# Patient Record
Sex: Female | Born: 1965 | Race: White | Hispanic: No | Smoking: Current every day smoker
Health system: Southern US, Community
[De-identification: ages and names within clinical notes are randomized; demographics above are authoritative.]

## PROBLEM LIST (undated history)

## (undated) DIAGNOSIS — M549 Dorsalgia, unspecified: Secondary | ICD-10-CM

## (undated) DIAGNOSIS — M1990 Unspecified osteoarthritis, unspecified site: Secondary | ICD-10-CM

## (undated) DIAGNOSIS — J45909 Unspecified asthma, uncomplicated: Secondary | ICD-10-CM

## (undated) DIAGNOSIS — E079 Disorder of thyroid, unspecified: Secondary | ICD-10-CM

## (undated) DIAGNOSIS — I471 Supraventricular tachycardia: Secondary | ICD-10-CM

## (undated) DIAGNOSIS — G8929 Other chronic pain: Secondary | ICD-10-CM

## (undated) DIAGNOSIS — M797 Fibromyalgia: Secondary | ICD-10-CM

## (undated) DIAGNOSIS — L4050 Arthropathic psoriasis, unspecified: Secondary | ICD-10-CM

## (undated) DIAGNOSIS — I10 Essential (primary) hypertension: Secondary | ICD-10-CM

## (undated) HISTORY — PX: LAPAROSCOPIC OVARIAN: SHX5906

## (undated) HISTORY — DX: Dorsalgia, unspecified: M54.9

## (undated) HISTORY — PX: TONSILLECTOMY: SUR1361

## (undated) HISTORY — DX: Arthropathic psoriasis, unspecified: L40.50

## (undated) HISTORY — DX: Other chronic pain: G89.29

## (undated) HISTORY — PX: APPENDECTOMY: SHX54

---

## 2013-11-03 ENCOUNTER — Emergency Department (HOSPITAL_COMMUNITY)
Admission: EM | Admit: 2013-11-03 | Discharge: 2013-11-03 | Disposition: A | Discharge status: Home / Self Care | Payer: Medicaid Other | Attending: Emergency Medicine | Admitting: Emergency Medicine

## 2013-11-03 ENCOUNTER — Encounter (HOSPITAL_COMMUNITY): Payer: Self-pay | Admitting: Emergency Medicine

## 2013-11-03 DIAGNOSIS — Z91040 Latex allergy status: Secondary | ICD-10-CM | POA: Insufficient documentation

## 2013-11-03 DIAGNOSIS — Y9301 Activity, walking, marching and hiking: Secondary | ICD-10-CM | POA: Insufficient documentation

## 2013-11-03 DIAGNOSIS — J45909 Unspecified asthma, uncomplicated: Secondary | ICD-10-CM | POA: Insufficient documentation

## 2013-11-03 DIAGNOSIS — Y929 Unspecified place or not applicable: Secondary | ICD-10-CM | POA: Insufficient documentation

## 2013-11-03 DIAGNOSIS — I10 Essential (primary) hypertension: Secondary | ICD-10-CM | POA: Insufficient documentation

## 2013-11-03 DIAGNOSIS — S91009A Unspecified open wound, unspecified ankle, initial encounter: Principal | ICD-10-CM

## 2013-11-03 DIAGNOSIS — F17200 Nicotine dependence, unspecified, uncomplicated: Secondary | ICD-10-CM | POA: Insufficient documentation

## 2013-11-03 DIAGNOSIS — W268XXA Contact with other sharp object(s), not elsewhere classified, initial encounter: Secondary | ICD-10-CM | POA: Insufficient documentation

## 2013-11-03 DIAGNOSIS — S81809A Unspecified open wound, unspecified lower leg, initial encounter: Principal | ICD-10-CM

## 2013-11-03 DIAGNOSIS — M129 Arthropathy, unspecified: Secondary | ICD-10-CM | POA: Insufficient documentation

## 2013-11-03 DIAGNOSIS — Z79899 Other long term (current) drug therapy: Secondary | ICD-10-CM | POA: Insufficient documentation

## 2013-11-03 DIAGNOSIS — S81009A Unspecified open wound, unspecified knee, initial encounter: Secondary | ICD-10-CM | POA: Insufficient documentation

## 2013-11-03 DIAGNOSIS — S81812A Laceration without foreign body, left lower leg, initial encounter: Secondary | ICD-10-CM

## 2013-11-03 DIAGNOSIS — F411 Generalized anxiety disorder: Secondary | ICD-10-CM | POA: Insufficient documentation

## 2013-11-03 HISTORY — DX: Essential (primary) hypertension: I10

## 2013-11-03 HISTORY — DX: Unspecified asthma, uncomplicated: J45.909

## 2013-11-03 HISTORY — DX: Fibromyalgia: M79.7

## 2013-11-03 HISTORY — DX: Supraventricular tachycardia: I47.1

## 2013-11-03 HISTORY — DX: Unspecified osteoarthritis, unspecified site: M19.90

## 2013-11-03 MED ORDER — LIDOCAINE-EPINEPHRINE-TETRACAINE (LET) SOLUTION
3.0000 mL | Freq: Once | NASAL | Status: AC
  Administered 2013-11-03: 3 mL via TOPICAL
  Filled 2013-11-03: qty 3

## 2013-11-03 MED ORDER — LIDOCAINE-EPINEPHRINE 1 %-1:100000 IJ SOLN
10.0000 mL | Freq: Once | INTRAMUSCULAR | Status: AC
  Administered 2013-11-03: 10 mL via INTRADERMAL

## 2013-11-03 MED ORDER — DOUBLE ANTIBIOTIC 500-10000 UNIT/GM EX OINT
TOPICAL_OINTMENT | Freq: Once | CUTANEOUS | Status: AC
  Administered 2013-11-03: 17:00:00 via TOPICAL

## 2013-11-03 MED ORDER — BACITRACIN ZINC 500 UNIT/GM EX OINT
TOPICAL_OINTMENT | CUTANEOUS | Status: AC
  Filled 2013-11-03: qty 0.9

## 2013-11-03 NOTE — ED Provider Notes (Signed)
CSN: [id number]     Arrival date & time 11/03/13  1526 History   First MD Initiated Contact with Patient 11/03/13 1542     Chief Complaint  Patient presents with  . Extremity Laceration     (Consider location/radiation/quality/duration/timing/severity/associated sxs/prior Treatment) HPI Patient reports she was at her friend's house who had a large star decoration she was going to attach to the side of her house however it was sitting on the ground and the patient didn't see it and she ran her left leg into one of the tips of the star. This happened less than an hour ago. She did not fall and denies any other injury. She states her tetanus is up-to-date.  PCP none  Past Medical History  Diagnosis Date  . Asthma   . Arthritis   . SVT (supraventricular tachycardia)   . Fibromyalgia   . Hypertension    Past Surgical History  Procedure Laterality Date  . Cesarean section    . Appendectomy     cardiac ablation  History reviewed. No pertinent family history. History  Substance Use Topics  . Smoking status: Current Some Day Smoker  . Smokeless tobacco: Not on file  . Alcohol Use: No   smokes 3-5 cigarettes a day On disability for fibromyalgia and psoriatic arthritis  OB History   Grav Para Term Preterm Abortions TAB SAB Ect Mult Living                 Review of Systems  All other systems reviewed and are negative.     Allergies  Adalat and Latex  Home Medications   Prior to Admission medications   Medication Sig Start Date End Date Taking? Authorizing Provider  buPROPion (WELLBUTRIN XL) 150 MG 24 hr tablet Take 150 mg by mouth daily.   Yes Historical Provider, MD  cyclobenzaprine (FLEXERIL) 10 MG tablet Take 10 mg by mouth 3 (three) times daily as needed for muscle spasms.   Yes Historical Provider, MD  LORazepam (ATIVAN) 2 MG tablet Take 2 mg by mouth daily as needed for anxiety.   Yes Historical Provider, MD  losartan (COZAAR) 100 MG tablet Take 100 mg by mouth  daily.   Yes Historical Provider, MD  omeprazole (PRILOSEC) 20 MG capsule Take 20 mg by mouth daily.   Yes Historical Provider, MD   Diovan Paroxetine Omeprazole Levoxyl bupropion      BP 184/112  Pulse 107  Temp(Src) 98 F (36.7 C) (Oral)  Resp 18  Ht 5\' 4"  (1.626 m)  Wt 230 lb (104.327 kg)  BMI 39.46 kg/m2  SpO2 98%  Vital signs normal except hypertension and tachycardia  Physical Exam  Nursing note and vitals reviewed. Constitutional: She is oriented to person, place, and time. She appears well-developed and well-nourished.  Non-toxic appearance. She does not appear ill. No distress.  HENT:  Head: Normocephalic and atraumatic.  Right Ear: External ear normal.  Left Ear: External ear normal.  Nose: Nose normal. No mucosal edema or rhinorrhea.  Mouth/Throat: Mucous membranes are normal. No dental abscesses or uvula swelling.  Eyes: Conjunctivae and EOM are normal. Pupils are equal, round, and reactive to light.  Neck: Normal range of motion and full passive range of motion without pain. Neck supple.  Pulmonary/Chest: Effort normal. No respiratory distress. She has no rhonchi. She exhibits no crepitus.  Abdominal: Normal appearance.  Musculoskeletal: Normal range of motion. She exhibits tenderness. She exhibits no edema.  Moves all extremities well.   Neurological: She is  alert and oriented to person, place, and time. She has normal strength. No cranial nerve deficit.  Skin: Skin is warm, dry and intact. No rash noted. No erythema. No pallor.  The patient has a 1 x 1 cm V-shaped laceration over her proximal left lateral leg inferior to the knee. It is through the dermis. There is mild bleeding. There is no debris noted on the wound. Please see photo  Psychiatric: Her speech is normal and behavior is normal. Her mood appears anxious.          ED Course  Procedures (including critical care time)  Medications  lidocaine-EPINEPHrine-tetracaine (LET) solution (3 mLs  Topical Given 11/03/13 1556)  polymixin-bacitracin (POLYSPORIN) ointment ( Topical Given 11/03/13 1641)  lidocaine-EPINEPHrine (XYLOCAINE W/EPI) 1 %-1:100000 (with pres) injection 10 mL (10 mLs Intradermal Given 11/03/13 1637)   The patient was concerned about being injected with Xylocaine. 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 Assured that we would not be going that deeply to numb her skin. She is agreeable to having LET placed on her skin first.   Patient states she has taken her blood pressure medicine today. She also states her blood pressure would improve when she goes home.  LACERATION REPAIR Performed by: Janice Norrie Authorized by: Janice Norrie Consent: Verbal consent obtained. Risks and benefits: risks, benefits and alternatives were discussed Consent given by: patient Patient identity confirmed: provided demographic data Prepped and Draped in normal sterile fashion Wound explored  Laceration Location: Proximal left lower leg  Laceration Length: 2 cm  No Foreign Bodies seen or palpated  Anesthesia: local infiltration and LET  Local anesthetic: lidocaine 1% + 1 epinephrine  Anesthetic total: 5 ml  Irrigation method: syringe with sterile saline/Betadine mix Amount of cleaning: standard  Skin closure: 4-0 nylon   Number of sutures: 3   Technique: Simple interrupted with mattress type suture to close the tip   Patient tolerance: Patient tolerated the procedure well with no immediate complications.     Labs Review Labs Reviewed - No data to display  Imaging Review No results found.   EKG Interpretation None      MDM   Final diagnoses:  Laceration of lower leg, left, initial encounter  Essential hypertension    Plan discharge   Rolland Porter, MD, Alanson Aly, MD 11/04/13 0020

## 2013-11-03 NOTE — Discharge Instructions (Signed)
Keep the wound clean and dry. Do use triple antibiotic ointment on the wound to help prevent infection. The sutures need to be removed in 10-12 days. You can return here or go to urgent care to have them removed. You should return sooner however for any signs of infection such as increasing redness, swelling, pain, drainage of pus, red streaks. You can use ice packs on the wound for discomfort. You can take acetaminophen or ibuprofen for pain as needed.    Sutured Wound Care Sutures are stitches that can be used to close wounds. Wound care helps prevent pain and infection.  HOME CARE INSTRUCTIONS   Rest and elevate the injured area until all the pain and swelling are gone.  Only take over-the-counter or prescription medicines for pain, discomfort, or fever as directed by your caregiver.  After 48 hours, gently wash the area with mild soap and water once a day, or as directed. Rinse off the soap. Pat the area dry with a clean towel. Do not rub the wound. This may cause bleeding.  Follow your caregiver's instructions for how often to change the bandage (dressing). Stop using a dressing after 2 days or after the wound stops draining.  If the dressing sticks, moisten it with soapy water and gently remove it.  Apply ointment on the wound as directed.  Avoid stretching a sutured wound.  Drink enough fluids to keep your urine clear or pale yellow.  Follow up with your caregiver for suture removal as directed.  Use sunscreen on your wound for the next 3 to 6 months so the scar will not darken. SEEK IMMEDIATE MEDICAL CARE IF:   Your wound becomes red, swollen, hot, or tender.  You have increasing pain in the wound.  You have a red streak that extends from the wound.  There is pus coming from the wound.  You have a fever.  You have shaking chills.  There is a bad smell coming from the wound.  You have persistent bleeding from the wound. MAKE SURE YOU:   Understand these  instructions.  Will watch your condition.  Will get help right away if you are not doing well or get worse. Document Released: 06/07/2004 Document Revised: 07/23/2011 Document Reviewed: 09/03/2010 [facility] Patient Information 2015 [place], [place]. This information is not intended to replace advice given to you by your health care provider. Make sure you discuss any questions you have with your health care provider.

## 2013-11-03 NOTE — ED Notes (Signed)
Non-stick dressing applied to pt's wound on L. Lower extremity.

## 2013-11-03 NOTE — ED Notes (Signed)
Pt presents with laceration to lower left leg. Pt states she cut her leg on an object while walking. Pt's last tetanus was 2 years ago.

## 2013-12-10 ENCOUNTER — Emergency Department (HOSPITAL_COMMUNITY)
Admission: EM | Admit: 2013-12-10 | Discharge: 2013-12-10 | Disposition: A | Discharge status: Home / Self Care | Payer: Medicaid Other | Attending: Emergency Medicine | Admitting: Emergency Medicine

## 2013-12-10 ENCOUNTER — Encounter (HOSPITAL_COMMUNITY): Payer: Self-pay | Admitting: Emergency Medicine

## 2013-12-10 DIAGNOSIS — H9209 Otalgia, unspecified ear: Secondary | ICD-10-CM | POA: Diagnosis present

## 2013-12-10 DIAGNOSIS — J45909 Unspecified asthma, uncomplicated: Secondary | ICD-10-CM | POA: Diagnosis not present

## 2013-12-10 DIAGNOSIS — Z79899 Other long term (current) drug therapy: Secondary | ICD-10-CM | POA: Diagnosis not present

## 2013-12-10 DIAGNOSIS — Z91040 Latex allergy status: Secondary | ICD-10-CM | POA: Diagnosis not present

## 2013-12-10 DIAGNOSIS — Z862 Personal history of diseases of the blood and blood-forming organs and certain disorders involving the immune mechanism: Secondary | ICD-10-CM | POA: Insufficient documentation

## 2013-12-10 DIAGNOSIS — F17200 Nicotine dependence, unspecified, uncomplicated: Secondary | ICD-10-CM | POA: Diagnosis not present

## 2013-12-10 DIAGNOSIS — I10 Essential (primary) hypertension: Secondary | ICD-10-CM | POA: Insufficient documentation

## 2013-12-10 DIAGNOSIS — H65199 Other acute nonsuppurative otitis media, unspecified ear: Secondary | ICD-10-CM | POA: Insufficient documentation

## 2013-12-10 DIAGNOSIS — M129 Arthropathy, unspecified: Secondary | ICD-10-CM | POA: Insufficient documentation

## 2013-12-10 DIAGNOSIS — Z8639 Personal history of other endocrine, nutritional and metabolic disease: Secondary | ICD-10-CM | POA: Insufficient documentation

## 2013-12-10 HISTORY — DX: Disorder of thyroid, unspecified: E07.9

## 2013-12-10 MED ORDER — LEVONORGESTREL 0.75 MG PO TABS: ORAL_TABLET | ORAL | Status: DC

## 2013-12-10 MED ORDER — AMOXICILLIN 500 MG PO CAPS: 500.0000 mg | ORAL_CAPSULE | Freq: Three times a day (TID) | ORAL | Status: DC

## 2013-12-10 NOTE — ED Provider Notes (Signed)
CSN: [id number]     Arrival date & time 12/10/13  1330 History  This chart was scribed for Maudry Diego, MD by Roxan Diesel, ED scribe.  This patient was seen in room APA09/APA09 and the patient's care was started at 1:49 PM.   Chief Complaint  Patient presents with  . Otalgia    Patient is a 48 y.o. female presenting with ear pain. The history is provided by the patient. No language interpreter was used.  Otalgia Location:  Bilateral Behind ear:  No abnormality Quality:  Pressure Severity:  Moderate Duration:  1 day Timing:  Constant Chronicity:  Recurrent Ineffective treatments:  None tried Associated symptoms: no abdominal pain, no congestion, no cough, no diarrhea, no ear discharge, no headaches and no rash     HPI Comments: Stephanie Osborne is a 48 y.o. female who presents to the Emergency Department complaining of persistent moderate bilateral ear pain and pressure that began last night.  She reports h/o recurrent sinus infections and speculates this may be the cause of her pain.  Pt also presents with elevated blood pressure at 169/112.  She states she saw her PCP back in [place] and was advised she may need to increase her BP medication dosage to 3x/day.  She does not remember what the medication is called.  She has not been able to find a PCP here since moving from [place] in May.  Pt states she had unprotected sex last night and wants a morning after pill.    Past Medical History  Diagnosis Date  . Asthma   . Arthritis   . SVT (supraventricular tachycardia)   . Fibromyalgia   . Hypertension   . Thyroid disease     Past Surgical History  Procedure Laterality Date  . Cesarean section    . Appendectomy      History reviewed. No pertinent family history.   History  Substance Use Topics  . Smoking status: Current Some Day Smoker  . Smokeless tobacco: Not on file  . Alcohol Use: No    OB History   Grav Para Term Preterm Abortions TAB SAB Ect Mult  Living                   Review of Systems  Constitutional: Negative for appetite change and fatigue.  HENT: Positive for ear pain. Negative for congestion, ear discharge and sinus pressure.   Eyes: Negative for discharge.  Respiratory: Negative for cough.   Cardiovascular: Negative for chest pain.  Gastrointestinal: Negative for abdominal pain and diarrhea.  Genitourinary: Negative for frequency and hematuria.  Musculoskeletal: Negative for back pain.  Skin: Negative for rash.  Neurological: Negative for seizures and headaches.  Psychiatric/Behavioral: Negative for hallucinations.      Allergies  Adalat and Latex  Home Medications   Prior to Admission medications   Medication Sig Start Date End Date Taking? Authorizing Provider  buPROPion (WELLBUTRIN XL) 150 MG 24 hr tablet Take 150 mg by mouth daily.    Historical Provider, MD  cyclobenzaprine (FLEXERIL) 10 MG tablet Take 10 mg by mouth 3 (three) times daily as needed for muscle spasms.    Historical Provider, MD  LORazepam (ATIVAN) 2 MG tablet Take 2 mg by mouth daily as needed for anxiety.    Historical Provider, MD  losartan (COZAAR) 100 MG tablet Take 100 mg by mouth daily.    Historical Provider, MD  omeprazole (PRILOSEC) 20 MG capsule Take 20 mg by mouth daily.    Historical  Provider, MD   BP 169/112  Pulse 113  Temp(Src) 99.2 F (37.3 C) (Oral)  Resp 16  Ht 5\' 4"  (1.626 m)  Wt 230 lb (104.327 kg)  BMI 39.46 kg/m2  SpO2 99%  LMP 11/11/2013  Physical Exam  Nursing note and vitals reviewed. Constitutional: She is oriented to person, place, and time. She appears well-developed.  HENT:  Head: Normocephalic.  Right TM inflamed  Eyes: Conjunctivae and EOM are normal. No scleral icterus.  Neck: Neck supple. No thyromegaly present.  Cardiovascular: Normal rate and regular rhythm.  Exam reveals no gallop and no friction rub.   No murmur heard. Pulmonary/Chest: No stridor. She has no wheezes. She has no rales.  She exhibits no tenderness.  Abdominal: She exhibits no distension. There is no tenderness. There is no rebound.  Musculoskeletal: Normal range of motion. She exhibits no edema.  Lymphadenopathy:    She has no cervical adenopathy.  Neurological: She is oriented to person, place, and time. She exhibits normal muscle tone. Coordination normal.  Skin: No rash noted. No erythema.  Psychiatric: She has a normal mood and affect. Her behavior is normal.    ED Course  Procedures (including critical care time)  DIAGNOSTIC STUDIES: Oxygen Saturation is 99% on room air, normal by my interpretation.    COORDINATION OF CARE: 1:56 PM-Discussed treatment plan with pt at bedside and pt agreed to plan.     Labs Review Labs Reviewed - No data to display  Imaging Review No results found.   EKG Interpretation None      MDM   Final diagnoses:  None    Htn, otitis media,  Birth control.  The chart was scribed for me under my direct supervision.  I personally performed the history, physical, and medical decision making and all procedures in the evaluation of this patient.Maudry Diego, MD 12/10/13 [phone]

## 2013-12-10 NOTE — ED Notes (Addendum)
Pain bil ears since last night, Recent sinus problem  Pt had unprotected sex last night. And wants morning after pill

## 2013-12-10 NOTE — Discharge Instructions (Signed)
Increase your bp medicine to one and a half pills twice a day.  Follow up with a family md

## 2014-01-27 ENCOUNTER — Other Ambulatory Visit: Payer: Self-pay | Admitting: Women's Health

## 2014-01-29 ENCOUNTER — Encounter (HOSPITAL_COMMUNITY): Payer: Self-pay | Admitting: Emergency Medicine

## 2014-01-29 ENCOUNTER — Emergency Department (HOSPITAL_COMMUNITY)
Admission: EM | Admit: 2014-01-29 | Discharge: 2014-01-30 | Disposition: A | Discharge status: Home / Self Care | Payer: Medicaid Other | Attending: Emergency Medicine | Admitting: Emergency Medicine

## 2014-01-29 ENCOUNTER — Emergency Department (HOSPITAL_COMMUNITY): Payer: Medicaid Other

## 2014-01-29 DIAGNOSIS — R55 Syncope and collapse: Secondary | ICD-10-CM | POA: Insufficient documentation

## 2014-01-29 DIAGNOSIS — Z8639 Personal history of other endocrine, nutritional and metabolic disease: Secondary | ICD-10-CM | POA: Insufficient documentation

## 2014-01-29 DIAGNOSIS — R002 Palpitations: Secondary | ICD-10-CM | POA: Insufficient documentation

## 2014-01-29 DIAGNOSIS — I10 Essential (primary) hypertension: Secondary | ICD-10-CM | POA: Insufficient documentation

## 2014-01-29 DIAGNOSIS — Z91040 Latex allergy status: Secondary | ICD-10-CM | POA: Diagnosis not present

## 2014-01-29 DIAGNOSIS — R51 Headache: Secondary | ICD-10-CM | POA: Insufficient documentation

## 2014-01-29 DIAGNOSIS — Z8739 Personal history of other diseases of the musculoskeletal system and connective tissue: Secondary | ICD-10-CM | POA: Insufficient documentation

## 2014-01-29 DIAGNOSIS — Z862 Personal history of diseases of the blood and blood-forming organs and certain disorders involving the immune mechanism: Secondary | ICD-10-CM | POA: Insufficient documentation

## 2014-01-29 DIAGNOSIS — M129 Arthropathy, unspecified: Secondary | ICD-10-CM | POA: Insufficient documentation

## 2014-01-29 DIAGNOSIS — Z79899 Other long term (current) drug therapy: Secondary | ICD-10-CM | POA: Diagnosis not present

## 2014-01-29 DIAGNOSIS — R5381 Other malaise: Secondary | ICD-10-CM | POA: Insufficient documentation

## 2014-01-29 DIAGNOSIS — R110 Nausea: Secondary | ICD-10-CM | POA: Insufficient documentation

## 2014-01-29 DIAGNOSIS — R42 Dizziness and giddiness: Secondary | ICD-10-CM | POA: Insufficient documentation

## 2014-01-29 DIAGNOSIS — R5383 Other fatigue: Secondary | ICD-10-CM | POA: Diagnosis not present

## 2014-01-29 DIAGNOSIS — Z880 Allergy status to penicillin: Secondary | ICD-10-CM | POA: Insufficient documentation

## 2014-01-29 DIAGNOSIS — J45909 Unspecified asthma, uncomplicated: Secondary | ICD-10-CM | POA: Insufficient documentation

## 2014-01-29 DIAGNOSIS — IMO0002 Reserved for concepts with insufficient information to code with codable children: Secondary | ICD-10-CM | POA: Insufficient documentation

## 2014-01-29 LAB — COMPREHENSIVE METABOLIC PANEL
ALT: 32 U/L (ref 0–35)
AST: 29 U/L (ref 0–37)
Albumin: 3.7 g/dL (ref 3.5–5.2)
Alkaline Phosphatase: 69 U/L (ref 39–117)
Anion gap: 13 (ref 5–15)
BILIRUBIN TOTAL: 0.2 mg/dL — AB (ref 0.3–1.2)
BUN: 19 mg/dL (ref 6–23)
CALCIUM: 9.2 mg/dL (ref 8.4–10.5)
CHLORIDE: 99 meq/L (ref 96–112)
CO2: 28 mEq/L (ref 19–32)
Creatinine, Ser: 1.05 mg/dL (ref 0.50–1.10)
GFR calc non Af Amer: 61 mL/min — ABNORMAL LOW (ref >90)
GFR, EST AFRICAN AMERICAN: 71 mL/min — AB (ref >90)
Glucose, Bld: 100 mg/dL — ABNORMAL HIGH (ref 70–99)
Potassium: 3.3 mEq/L — ABNORMAL LOW (ref 3.7–5.3)
Sodium: 140 mEq/L (ref 137–147)
Total Protein: 7.6 g/dL (ref 6.0–8.3)

## 2014-01-29 LAB — CBC
HCT: 38 % (ref 36.0–46.0)
Hemoglobin: 13.1 g/dL (ref 12.0–15.0)
MCH: 28.8 pg (ref 26.0–34.0)
MCHC: 34.5 g/dL (ref 30.0–36.0)
MCV: 83.5 fL (ref 78.0–100.0)
Platelets: 287 10*3/uL (ref 150–400)
RBC: 4.55 MIL/uL (ref 3.87–5.11)
RDW: 14.1 % (ref 11.5–15.5)
WBC: 9.9 10*3/uL (ref 4.0–10.5)

## 2014-01-29 LAB — TROPONIN I: Troponin I: <0.3 ng/mL (ref <0.30)

## 2014-01-29 MED ORDER — KETOROLAC TROMETHAMINE 30 MG/ML IJ SOLN
30.0000 mg | Freq: Once | INTRAMUSCULAR | Status: AC
  Administered 2014-01-29: 30 mg via INTRAVENOUS
  Filled 2014-01-29: qty 1

## 2014-01-29 MED ORDER — OXYCODONE-ACETAMINOPHEN 5-325 MG PO TABS
1.0000 | ORAL_TABLET | Freq: Once | ORAL | Status: AC
  Administered 2014-01-29: 1 via ORAL
  Filled 2014-01-29: qty 1

## 2014-01-29 NOTE — ED Notes (Signed)
Patient has hx of SVT.  Had episode about an hour ago which converted itself.  She was left w/residual weakness, dizziness, nausea, numbness/tingling in upper trunk/extremeties and her nose.  This is unusual for her following an episode of SVT.  Denies CP.  Reports blurred vision and decreased coordination which has cleared.  Reports L sided, pressure like headache 5/10.

## 2014-01-29 NOTE — ED Notes (Signed)
Pt ambulated for 3 full laps around the nurse's station. No change in pts condition noted, no reports of dizziness or SOB reported by pt; O2 sats remained at 99-100% on RA. Will continue to monitor.

## 2014-01-29 NOTE — ED Provider Notes (Signed)
CSN: [id number]     Arrival date & time 01/29/14  2055 History   First MD Initiated Contact with Patient 01/29/14 2059   This chart was scribed for Hoy Morn, MD by Rosary Lively, ED scribe. This patient was seen in room APA04/APA04 and the patient's care was started at 9:20 PM.    Chief Complaint  Patient presents with  . Dizziness  . Nausea   The history is provided by the patient. No language interpreter was used.   HPI Comments:  Stephanie Osborne is a 48 y.o. female with a h/o SVT who presents to the Emergency Department complaining of residual, weakness, dizziness, nausea, numbness and tingling in the upper extremities following an episode of SVT, approximately 1 hour ago. Pt reports that after SVT converts itself, she normally feels fine, but she continues to experiences aforementioned symptoms, which is unusual for her. Pt reports that she generally experiences 1 to 2 seconds of palpitations. Pt reports that she experiences an episode of SVT one to two times a week. Pt reports that she exercises, and is able to mow the lawn with a push mower without CP or SOB. Denies CP at this time. Symptoms are mild inseverity. Pt has not seen a Cardiologist in 9 years.   Past Medical History  Diagnosis Date  . Asthma   . Arthritis   . SVT (supraventricular tachycardia)   . Fibromyalgia   . Hypertension   . Thyroid disease    Past Surgical History  Procedure Laterality Date  . Cesarean section    . Appendectomy     History reviewed. No pertinent family history. History  Substance Use Topics  . Smoking status: Current Every Day Smoker -- 0.50 packs/day    Types: Cigarettes  . Smokeless tobacco: Not on file  . Alcohol Use: No   OB History   Grav Para Term Preterm Abortions TAB SAB Ect Mult Living                 Review of Systems  Gastrointestinal: Positive for nausea.  Neurological: Positive for dizziness, weakness, numbness and headaches.  All other systems reviewed and are  negative.   Allergies  Adalat; Latex; and Ampicillin  Home Medications   Prior to Admission medications   Medication Sig Start Date End Date Taking? Authorizing Provider  Acetaminophen-Caff-Pyrilamine (MENSTRUAL COMPLETE) 500-60-15 MG TABS Take 1 tablet by mouth daily as needed (for pain).   Yes Historical Provider, MD  albuterol (PROVENTIL HFA;VENTOLIN HFA) 108 (90 BASE) MCG/ACT inhaler Inhale into the lungs every 6 (six) hours as needed for wheezing or shortness of breath.   Yes Historical Provider, MD  beclomethasone (QVAR) 80 MCG/ACT inhaler Inhale 2 puffs into the lungs 2 (two) times daily.   Yes Historical Provider, MD  levothyroxine (SYNTHROID, LEVOTHROID) 125 MCG tablet Take 125 mcg by mouth daily before breakfast.   Yes Historical Provider, MD  lisinopril-hydrochlorothiazide (PRINZIDE,ZESTORETIC) 20-12.5 MG per tablet Take 1 tablet by mouth 2 (two) times daily.   Yes Historical Provider, MD  omeprazole (PRILOSEC) 20 MG capsule Take 20 mg by mouth 2 (two) times daily before a meal.    Yes Historical Provider, MD  PARoxetine (PAXIL) 20 MG tablet Take 20 mg by mouth daily.   Yes Historical Provider, MD  rOPINIRole (REQUIP) 4 MG tablet Take 4 mg by mouth at bedtime.   Yes Historical Provider, MD   BP 153/96  Pulse 101  Temp(Src) 98.1 F (36.7 C) (Oral)  Resp 20  Ht  5\' 4"  (1.626 m)  Wt 229 lb (103.874 kg)  BMI 39.29 kg/m2  SpO2 98%  LMP 01/21/2014 Physical Exam  Nursing note and vitals reviewed. Constitutional: She is oriented to person, place, and time. She appears well-developed and well-nourished. No distress.  HENT:  Head: Normocephalic and atraumatic.  Eyes: EOM are normal.  Neck: Normal range of motion.  Cardiovascular: Normal rate, regular rhythm and normal heart sounds.   Pulmonary/Chest: Effort normal and breath sounds normal.  Abdominal: Soft. She exhibits no distension. There is no tenderness.  Musculoskeletal: Normal range of motion.  Neurological: She is alert  and oriented to person, place, and time.  Skin: Skin is warm and dry.  Psychiatric: She has a normal mood and affect. Judgment normal.    ED Course  Procedures  DIAGNOSTIC STUDIES: Oxygen Saturation is 98% on RA, normal by my interpretation.  COORDINATION OF CARE: plan.  Labs Review Labs Reviewed  COMPREHENSIVE METABOLIC PANEL - Abnormal; Notable for the following:    Potassium 3.3 (*)    Glucose, Bld 100 (*)    Total Bilirubin 0.2 (*)    GFR calc non Af Amer 61 (*)    GFR calc Af Amer 71 (*)    All other components within normal limits  CBC  TROPONIN I    Imaging Review Dg Chest 2 View  01/29/2014   CLINICAL DATA:  Weakness, dizziness, nausea, history of supraventricular tachycardia  EXAM: CHEST  2 VIEW  COMPARISON:  None.  FINDINGS: The lungs are mildly hyperinflated with hemidiaphragm flattening. There is no focal infiltrate. The heart and pulmonary vascularity are normal. There is no pleural effusion or pneumothorax. There is curvature of the mid thoracic spine convex towards the right.  IMPRESSION: COPD.  There is no acute cardiopulmonary abnormality.   Electronically Signed   By: David  Martinique   On: 01/29/2014 21:50     EKG Interpretation   Date/Time:  Friday January 29 2014 21:05:31 EDT Ventricular Rate:  102 PR Interval:  154 QRS Duration: 92 QT Interval:  352 QTC Calculation: 458 R Axis:   20 Text Interpretation:  Sinus tachycardia Atrial premature complex Right  atrial enlargement Anteroseptal infarct, old Repol abnrm suggests  ischemia, lateral leads Minimal ST elevation, inferior leads No old  tracing to compare Confirmed by Quenna Doepke  MD, Lennette Bihari ([id number]) on 01/29/2014  9:07:29 PM      MDM   Final diagnoses:  Palpitations    Well appearing. Outpatient cards follow up. May require holter monitor. May require referral to EP depending on cardiology input. No syncope. Doubt stroke. NSR now. Telemetry reviewed while in ER, no ectopy or arrythmia  I  personally performed the services described in this documentation, which was scribed in my presence. The recorded information has been reviewed and is accurate.      Hoy Morn, MD 01/30/14 [phone]

## 2014-01-30 NOTE — ED Notes (Signed)
Pt verbalized understanding of discharge instructions and directions for follow-up care with info provided for cardiologist. Pt voiced no additional needs or questions; ambulatory and off unit at this time.

## 2014-10-20 ENCOUNTER — Other Ambulatory Visit (HOSPITAL_COMMUNITY): Payer: Self-pay | Admitting: Nurse Practitioner

## 2014-10-20 DIAGNOSIS — M545 Low back pain: Secondary | ICD-10-CM

## 2014-10-20 DIAGNOSIS — M546 Pain in thoracic spine: Secondary | ICD-10-CM

## 2014-10-20 DIAGNOSIS — M542 Cervicalgia: Secondary | ICD-10-CM

## 2014-10-26 ENCOUNTER — Ambulatory Visit (HOSPITAL_COMMUNITY): Payer: Medicaid Other

## 2014-12-23 ENCOUNTER — Ambulatory Visit: Payer: Medicaid Other | Admitting: Cardiology

## 2014-12-27 ENCOUNTER — Encounter: Payer: Self-pay | Admitting: Internal Medicine

## 2015-01-05 ENCOUNTER — Ambulatory Visit: Payer: Medicaid Other | Admitting: Gastroenterology

## 2015-01-07 ENCOUNTER — Ambulatory Visit: Payer: Medicaid Other | Admitting: Cardiology

## 2015-01-13 ENCOUNTER — Ambulatory Visit (INDEPENDENT_AMBULATORY_CARE_PROVIDER_SITE_OTHER): Payer: Medicaid Other | Admitting: Gastroenterology

## 2015-01-13 ENCOUNTER — Encounter: Payer: Self-pay | Admitting: Gastroenterology

## 2015-01-13 ENCOUNTER — Other Ambulatory Visit: Payer: Self-pay

## 2015-01-13 VITALS — BP 154/92 | HR 81 | Temp 99.0°F | Ht 64.0 in | Wt 218.2 lb

## 2015-01-13 DIAGNOSIS — K219 Gastro-esophageal reflux disease without esophagitis: Secondary | ICD-10-CM | POA: Diagnosis not present

## 2015-01-13 DIAGNOSIS — D509 Iron deficiency anemia, unspecified: Secondary | ICD-10-CM | POA: Diagnosis not present

## 2015-01-13 DIAGNOSIS — R195 Other fecal abnormalities: Secondary | ICD-10-CM | POA: Diagnosis not present

## 2015-01-13 MED ORDER — FERROUS SULFATE 325 (65 FE) MG PO TABS: 325.0000 mg | ORAL_TABLET | Freq: Two times a day (BID) | ORAL | Status: AC

## 2015-01-13 MED ORDER — PEG 3350-KCL-NA BICARB-NACL 420 G PO SOLR: 4000.0000 mL | ORAL | Status: AC

## 2015-01-13 NOTE — Progress Notes (Signed)
Primary Care Physician:  Wendie Simmer, MD  Primary Gastroenterologist:  Garfield Cornea, MD   Chief Complaint  Patient presents with  . Anemia  . heme + stool sample    HPI:  Stephanie Osborne is a 49 y.o. female here st the request of Wendie Simmer, NP for further evaluation of anemia, heme + stool. Feels well from GI standpoint. Used to take ibuprofen for chronic back pain but quit once she was found to have anemia and blood in stool. Denies constipation, diarrhea, melena, brbpr. No heartburn on PPI. Chronic gerd for years. No dysphagia, vomiting, anorexia. No prior EGD/TCS.  Current Outpatient Prescriptions  Medication Sig Dispense Refill  . Acetaminophen-Caff-Pyrilamine (MENSTRUAL COMPLETE) 500-60-15 MG TABS Take 1 tablet by mouth daily as needed (for pain).    Marland Kitchen albuterol (PROVENTIL HFA;VENTOLIN HFA) 108 (90 BASE) MCG/ACT inhaler Inhale into the lungs every 6 (six) hours as needed for wheezing or shortness of breath.    Marland Kitchen gemfibrozil (LOPID) 600 MG tablet   11  . levothyroxine (SYNTHROID, LEVOTHROID) 125 MCG tablet Take 125 mcg by mouth daily before breakfast.    . lisinopril-hydrochlorothiazide (PRINZIDE,ZESTORETIC) 20-12.5 MG per tablet Take 1 tablet by mouth 2 (two) times daily.    . metoprolol succinate (TOPROL-XL) 25 MG 24 hr tablet   1  . montelukast (SINGULAIR) 10 MG tablet   11  . omeprazole (PRILOSEC) 20 MG capsule Take 20 mg by mouth 2 (two) times daily before a meal.     . PARoxetine (PAXIL) 20 MG tablet Take 20 mg by mouth daily.    Marland Kitchen rOPINIRole (REQUIP) 4 MG tablet Take 4 mg by mouth at bedtime.     No current facility-administered medications for this visit.    Allergies as of 01/13/2015 - Review Complete 01/13/2015  Allergen Reaction Noted  . Adalat [nifedipine]  11/03/2013  . Latex Itching 11/03/2013  . Propranolol Swelling 01/13/2015  . Ampicillin Rash 01/29/2014    Past Medical History  Diagnosis Date  . Asthma   . Arthritis   . SVT (supraventricular  tachycardia)     s/p ablation 2006  . Fibromyalgia   . Hypertension   . Thyroid disease   . Psoriatic arthritis     Past Surgical History  Procedure Laterality Date  . Cesarean section    . Appendectomy    . Laparoscopic ovarian      left  . Tonsillectomy      Family History  Problem Relation Age of Onset  . Lung cancer Mother   . Colon cancer Neg Hx     none known    Social History   Social History  . Marital Status: Divorced    Spouse Name: N/A  . Number of Children: 3  . Years of Education: N/A   Occupational History  . Not on file.   Social History Main Topics  . Smoking status: Current Every Day Smoker -- 0.50 packs/day    Types: Cigarettes  . Smokeless tobacco: Not on file  . Alcohol Use: No  . Drug Use: No  . Sexual Activity: Yes    Birth Control/ Protection: None   Other Topics Concern  . Not on file   Social History Narrative      ROS:  General: Negative for anorexia, weight loss, fever, chills, fatigue, weakness. Eyes: Negative for vision changes.  ENT: Negative for hoarseness, difficulty swallowing , nasal congestion. CV: Negative for chest pain, angina, palpitations, dyspnea on exertion, peripheral edema.  Respiratory: Negative for dyspnea  at rest, dyspnea on exertion, cough, sputum, wheezing.  GI: See history of present illness. GU:  Negative for dysuria, hematuria, urinary incontinence, urinary frequency, nocturnal urination.  MS: Negative for joint pain, +chronic low back pain.  Derm: Negative for rash or itching.  Neuro: Negative for weakness, abnormal sensation, seizure, frequent headaches, memory loss, confusion.  Psych: Negative for anxiety, depression, suicidal ideation, hallucinations.  Endo: Negative for unusual weight change.  Heme: Negative for bruising or bleeding. Allergy: Negative for rash or hives.    Physical Examination:  BP 154/92 mmHg  Pulse 81  Temp(Src) 99 F (37.2 C)  Ht 5\' 4"  (1.626 m)  Wt 218 lb 3.2 oz  (98.975 kg)  BMI 37.44 kg/m2  LMP 01/11/2015 (Approximate)   General: Well-nourished, well-developed in no acute distress.  Head: Normocephalic, atraumatic.   Eyes: Conjunctiva pink, no icterus. Mouth: Oropharyngeal mucosa moist and pink , no lesions erythema or exudate. Neck: Supple without thyromegaly, masses, or lymphadenopathy.  Lungs: Clear to auscultation bilaterally.  Heart: Regular rate and rhythm, no murmurs rubs or gallops.  Abdomen: Bowel sounds are normal, nontender, nondistended, no hepatosplenomegaly or masses, no abdominal bruits or    hernia , no rebound or guarding.   Rectal: not performed Extremities: No lower extremity edema. No clubbing or deformities.  Neuro: Alert and oriented x 4 , grossly normal neurologically.  Skin: Warm and dry, no rash or jaundice.   Psych: Alert and cooperative, normal mood and affect.  Labs: 09/2014 WBC 7100, H/H 11.4/34.6, MCV 79, Platelets 203, iron 36, TIBC 522, sat 7%, ferritin 8, tbili 0.4, AP 45, AST 15, ALT 10, alb 3.9, cre 0.74  05/2014 H/H 13.3/39.3  Imaging Studies: No results found.

## 2015-01-13 NOTE — Patient Instructions (Signed)
1. Colonoscopy and upper endoscopy with Dr. Gala Romney. See separate instructions.  2. Start iron twice a day. RX sent to your pharmacy.

## 2015-01-17 ENCOUNTER — Encounter: Payer: Self-pay | Admitting: Gastroenterology

## 2015-01-17 NOTE — Assessment & Plan Note (Addendum)
Recent onset IDA, heme + stool in setting of chronic controlled GERD and ibuprofen use. No prior EGD/colonoscopy. Recommend colonoscopy and upper endoscopy to evaluate IDA/heme+stool and rule out complicated GERD. Patient is concerned about adequate sedation. She has polypharmacy, severe insomnia. Utilize deep sedation in OR.  I have discussed the risks, alternatives, benefits with regards to but not limited to the risk of reaction to medication, bleeding, infection, perforation and the patient is agreeable to proceed. Written consent to be obtained.  Start oral iron therapy BID.

## 2015-01-18 NOTE — Progress Notes (Signed)
cc'ed to pcp °

## 2015-01-20 ENCOUNTER — Telehealth: Payer: Self-pay | Admitting: Gastroenterology

## 2015-01-20 NOTE — Telephone Encounter (Signed)
Recently started iron therapy. Please have patient hold for 7 days before upcoming colonoscopy.

## 2015-01-24 NOTE — Telephone Encounter (Signed)
Called pt LMOM regarding holding her Iron prior to TCS

## 2015-01-31 ENCOUNTER — Encounter (HOSPITAL_COMMUNITY): Payer: Self-pay

## 2015-01-31 ENCOUNTER — Encounter (HOSPITAL_COMMUNITY)
Admission: RE | Admit: 2015-01-31 | Discharge: 2015-01-31 | Disposition: A | Discharge status: Home / Self Care | Payer: Medicaid Other | Source: Ambulatory Visit | Attending: Internal Medicine | Admitting: Internal Medicine

## 2015-01-31 ENCOUNTER — Other Ambulatory Visit: Payer: Self-pay

## 2015-01-31 DIAGNOSIS — R195 Other fecal abnormalities: Secondary | ICD-10-CM | POA: Diagnosis not present

## 2015-01-31 DIAGNOSIS — K219 Gastro-esophageal reflux disease without esophagitis: Secondary | ICD-10-CM | POA: Insufficient documentation

## 2015-01-31 DIAGNOSIS — D509 Iron deficiency anemia, unspecified: Secondary | ICD-10-CM | POA: Insufficient documentation

## 2015-01-31 DIAGNOSIS — Z01818 Encounter for other preprocedural examination: Secondary | ICD-10-CM | POA: Insufficient documentation

## 2015-01-31 LAB — CBC
HEMATOCRIT: 35.2 % — AB (ref 36.0–46.0)
Hemoglobin: 11 g/dL — ABNORMAL LOW (ref 12.0–15.0)
MCH: 23.3 pg — AB (ref 26.0–34.0)
MCHC: 31.3 g/dL (ref 30.0–36.0)
MCV: 74.6 fL — AB (ref 78.0–100.0)
Platelets: 335 10*3/uL (ref 150–400)
RBC: 4.72 MIL/uL (ref 3.87–5.11)
RDW: 16 % — AB (ref 11.5–15.5)
WBC: 9.7 10*3/uL (ref 4.0–10.5)

## 2015-01-31 LAB — BASIC METABOLIC PANEL
ANION GAP: 6 (ref 5–15)
BUN: 19 mg/dL (ref 6–20)
CALCIUM: 8.9 mg/dL (ref 8.9–10.3)
CO2: 25 mmol/L (ref 22–32)
Chloride: 104 mmol/L (ref 101–111)
Creatinine, Ser: 0.86 mg/dL (ref 0.44–1.00)
GFR calc Af Amer: >60 mL/min (ref >60)
GFR calc non Af Amer: >60 mL/min (ref >60)
GLUCOSE: 109 mg/dL — AB (ref 65–99)
Potassium: 3.7 mmol/L (ref 3.5–5.1)
Sodium: 135 mmol/L (ref 135–145)

## 2015-01-31 LAB — HCG, SERUM, QUALITATIVE: Preg, Serum: NEGATIVE

## 2015-01-31 NOTE — Patient Instructions (Signed)
Stephanie Osborne  01/31/2015     @PREFPERIOPPHARMACY @   Your procedure is scheduled on 02/03/2015.  Report to [facility] at 8:45 A.M.  Call this number if you have problems the morning of surgery:  [phone]   Remember:  Do not eat food or drink liquids after midnight.  Take these medicines the morning of surgery with A SIP OF WATER Lopid, Synthroid, Metoprolol, Singulair, Prilosec, Paxil, Requip, Albuterol (and bring with You)   Do not wear jewelry, make-up or nail polish.  Do not wear lotions, powders, or perfumes.  You may wear deodorant.  Do not shave 48 hours prior to surgery.  Men may shave face and neck.  Do not bring valuables to the hospital.  [facility] is not responsible for any belongings or valuables.  Contacts, dentures or bridgework may not be worn into surgery.  Leave your suitcase in the car.  After surgery it may be brought to your room.  For patients admitted to the hospital, discharge time will be determined by your treatment team.  Patients discharged the day of surgery will not be allowed to drive home.    Please read over the following fact sheets that you were given. Anesthesia Post-op Instructions     PATIENT INSTRUCTIONS POST-ANESTHESIA  IMMEDIATELY FOLLOWING SURGERY:  Do not drive or operate machinery for the first twenty four hours after surgery.  Do not make any important decisions for twenty four hours after surgery or while taking narcotic pain medications or sedatives.  If you develop intractable nausea and vomiting or a severe headache please notify your doctor immediately.  FOLLOW-UP:  Please make an appointment with your surgeon as instructed. You do not need to follow up with anesthesia unless specifically instructed to do so.  WOUND CARE INSTRUCTIONS (if applicable):  Keep a dry clean dressing on the anesthesia/puncture wound site if there is drainage.  Once the wound has quit draining you may leave it open to air.  Generally you should  leave the bandage intact for twenty four hours unless there is drainage.  If the epidural site drains for more than 36-48 hours please call the anesthesia department.  QUESTIONS?:  Please feel free to call your physician or the hospital operator if you have any questions, and they will be happy to assist you.      Esophagogastroduodenoscopy Esophagogastroduodenoscopy (EGD) is a procedure to examine the lining of the esophagus, stomach, and first part of the small intestine (duodenum). A long, flexible, lighted tube with a camera attached (endoscope) is inserted down the throat to view these organs. This procedure is done to detect problems or abnormalities, such as inflammation, bleeding, ulcers, or growths, in order to treat them. The procedure lasts about 5-20 minutes. It is usually an outpatient procedure, but it may need to be performed in emergency cases in the hospital. LET YOUR CAREGIVER KNOW ABOUT:   Allergies to food or medicine.  All medicines you are taking, including vitamins, herbs, eyedrops, and over-the-counter medicines and creams.  Use of steroids (by mouth or creams).  Previous problems you or members of your family have had with the use of anesthetics.  Any blood disorders you have.  Previous surgeries you have had.  Other health problems you have.  Possibility of pregnancy, if this applies. RISKS AND COMPLICATIONS  Generally, EGD is a safe procedure. However, as with any procedure, complications can occur. Possible complications include:  Infection.  Bleeding.  Tearing (perforation) of the esophagus, stomach, or duodenum.  Difficulty breathing or not being able to breath.  Excessive sweating.  Spasms of the larynx.  Slowed heartbeat.  Low blood pressure. BEFORE THE PROCEDURE  Do not eat or drink anything for 6-8 hours before the procedure or as directed by your caregiver.  Ask your caregiver about changing or stopping your regular medicines.  If you  wear dentures, be prepared to remove them before the procedure.  Arrange for someone to drive you home after the procedure. PROCEDURE   A vein will be accessed to give medicines and fluids. A medicine to relax you (sedative) and a pain reliever will be given through that access into the vein.  A numbing medicine (local anesthetic) may be sprayed on your throat for comfort and to stop you from gagging or coughing.  A mouth guard may be placed in your mouth to protect your teeth and to keep you from biting on the endoscope.  You will be asked to lie on your left side.  The endoscope is inserted down your throat and into the esophagus, stomach, and duodenum.  Air is put through the endoscope to allow your caregiver to view the lining of your esophagus clearly.  The esophagus, stomach, and duodenum is then examined. During the exam, your caregiver may:  Remove tissue to be examined under a microscope (biopsy) for inflammation, infection, or other medical problems.  Remove growths.  Remove objects (foreign bodies) that are stuck.  Treat any bleeding with medicines or other devices that stop tissues from bleeding (hot cautery, clipping devices).  Widen (dilate) or stretch narrowed areas of the esophagus and stomach.  The endoscope will then be withdrawn. AFTER THE PROCEDURE  You will be taken to a recovery area to be monitored. You will be able to go home once you are stable and alert.  Do not eat or drink anything until the local anesthetic and numbing medicines have worn off. You may choke.  It is normal to feel bloated, have pain with swallowing, or have a sore throat for a short time. This will wear off.  Your caregiver should be able to discuss his or her findings with you. It will take longer to discuss the test results if any biopsies were taken. Document Released: 08/31/2004 Document Revised: 09/14/2013 Document Reviewed: 04/02/2012 [facility] Patient Information 2015  [place], [place]. This information is not intended to replace advice given to you by your health care provider. Make sure you discuss any questions you have with your health care provider. Colonoscopy A colonoscopy is an exam to look at the entire large intestine (colon). This exam can help find problems such as tumors, polyps, inflammation, and areas of bleeding. The exam takes about 1 hour.  LET [facility] CARE PROVIDER KNOW ABOUT:   Any allergies you have.  All medicines you are taking, including vitamins, herbs, eye drops, creams, and over-the-counter medicines.  Previous problems you or members of your family have had with the use of anesthetics.  Any blood disorders you have.  Previous surgeries you have had.  Medical conditions you have. RISKS AND COMPLICATIONS  Generally, this is a safe procedure. However, as with any procedure, complications can occur. Possible complications include:  Bleeding.  Tearing or rupture of the colon wall.  Reaction to medicines given during the exam.  Infection (rare). BEFORE THE PROCEDURE   Ask your health care provider about changing or stopping your regular medicines.  You may be prescribed an oral bowel prep. This involves drinking a large amount of  medicated liquid, starting the day before your procedure. The liquid will cause you to have multiple loose stools until your stool is almost clear or light green. This cleans out your colon in preparation for the procedure.  Do not eat or drink anything else once you have started the bowel prep, unless your health care provider tells you it is safe to do so.  Arrange for someone to drive you home after the procedure. PROCEDURE   You will be given medicine to help you relax (sedative).  You will lie on your side with your knees bent.  A long, flexible tube with a light and camera on the end (colonoscope) will be inserted through the rectum and into the colon. The camera sends video back to a  computer screen as it moves through the colon. The colonoscope also releases carbon dioxide gas to inflate the colon. This helps your health care provider see the area better.  During the exam, your health care provider may take a small tissue sample (biopsy) to be examined under a microscope if any abnormalities are found.  The exam is finished when the entire colon has been viewed. AFTER THE PROCEDURE   Do not drive for 24 hours after the exam.  You may have a small amount of blood in your stool.  You may pass moderate amounts of gas and have mild abdominal cramping or bloating. This is caused by the gas used to inflate your colon during the exam.  Ask when your test results will be ready and how you will get your results. Make sure you get your test results. Document Released: 04/27/2000 Document Revised: 02/18/2013 Document Reviewed: 01/05/2013 [facility] Patient Information 2015 [place], [place]. This information is not intended to replace advice given to you by your health care provider. Make sure you discuss any questions you have with your health care provider.

## 2015-02-03 ENCOUNTER — Encounter (HOSPITAL_COMMUNITY)
Admission: RE | Disposition: A | Discharge status: Home / Self Care | Payer: Self-pay | Source: Ambulatory Visit | Attending: Internal Medicine

## 2015-02-03 ENCOUNTER — Ambulatory Visit (HOSPITAL_COMMUNITY): Payer: Medicaid Other | Admitting: Anesthesiology

## 2015-02-03 ENCOUNTER — Encounter (HOSPITAL_COMMUNITY): Payer: Self-pay | Admitting: *Deleted

## 2015-02-03 ENCOUNTER — Ambulatory Visit (HOSPITAL_COMMUNITY)
Admission: RE | Admit: 2015-02-03 | Discharge: 2015-02-03 | Disposition: A | Discharge status: Home / Self Care | Payer: Medicaid Other | Source: Ambulatory Visit | Attending: Internal Medicine | Admitting: Internal Medicine

## 2015-02-03 DIAGNOSIS — Z79899 Other long term (current) drug therapy: Secondary | ICD-10-CM | POA: Diagnosis not present

## 2015-02-03 DIAGNOSIS — E079 Disorder of thyroid, unspecified: Secondary | ICD-10-CM | POA: Insufficient documentation

## 2015-02-03 DIAGNOSIS — K449 Diaphragmatic hernia without obstruction or gangrene: Secondary | ICD-10-CM | POA: Insufficient documentation

## 2015-02-03 DIAGNOSIS — L4050 Arthropathic psoriasis, unspecified: Secondary | ICD-10-CM | POA: Insufficient documentation

## 2015-02-03 DIAGNOSIS — D120 Benign neoplasm of cecum: Secondary | ICD-10-CM | POA: Insufficient documentation

## 2015-02-03 DIAGNOSIS — M797 Fibromyalgia: Secondary | ICD-10-CM | POA: Insufficient documentation

## 2015-02-03 DIAGNOSIS — K2970 Gastritis, unspecified, without bleeding: Secondary | ICD-10-CM

## 2015-02-03 DIAGNOSIS — K5730 Diverticulosis of large intestine without perforation or abscess without bleeding: Secondary | ICD-10-CM | POA: Diagnosis not present

## 2015-02-03 DIAGNOSIS — I10 Essential (primary) hypertension: Secondary | ICD-10-CM | POA: Insufficient documentation

## 2015-02-03 DIAGNOSIS — K219 Gastro-esophageal reflux disease without esophagitis: Secondary | ICD-10-CM | POA: Diagnosis not present

## 2015-02-03 DIAGNOSIS — Z860100 Personal history of colon polyps, unspecified: Secondary | ICD-10-CM | POA: Insufficient documentation

## 2015-02-03 DIAGNOSIS — K635 Polyp of colon: Secondary | ICD-10-CM | POA: Diagnosis not present

## 2015-02-03 DIAGNOSIS — D125 Benign neoplasm of sigmoid colon: Secondary | ICD-10-CM | POA: Diagnosis not present

## 2015-02-03 DIAGNOSIS — D122 Benign neoplasm of ascending colon: Secondary | ICD-10-CM

## 2015-02-03 DIAGNOSIS — D649 Anemia, unspecified: Secondary | ICD-10-CM | POA: Diagnosis not present

## 2015-02-03 DIAGNOSIS — R195 Other fecal abnormalities: Secondary | ICD-10-CM | POA: Diagnosis not present

## 2015-02-03 HISTORY — PX: ESOPHAGOGASTRODUODENOSCOPY (EGD) WITH PROPOFOL: SHX5813

## 2015-02-03 HISTORY — PX: COLONOSCOPY WITH PROPOFOL: SHX5780

## 2015-02-03 HISTORY — PX: POLYPECTOMY: SHX5525

## 2015-02-03 SURGERY — COLONOSCOPY WITH PROPOFOL
Anesthesia: Monitor Anesthesia Care

## 2015-02-03 MED ORDER — PROPOFOL INFUSION 10 MG/ML OPTIME
INTRAVENOUS | Status: DC | PRN
  Administered 2015-02-03: 125 ug/kg/min via INTRAVENOUS

## 2015-02-03 MED ORDER — STERILE WATER FOR IRRIGATION IR SOLN
Status: DC | PRN
  Administered 2015-02-03: 1000 mL

## 2015-02-03 MED ORDER — LIDOCAINE VISCOUS 2 % MT SOLN
15.0000 mL | Freq: Once | OROMUCOSAL | Status: AC
  Administered 2015-02-03: 6 mL via OROMUCOSAL
  Filled 2015-02-03: qty 15

## 2015-02-03 MED ORDER — PROPOFOL 10 MG/ML IV BOLUS
INTRAVENOUS | Status: AC
  Filled 2015-02-03: qty 20

## 2015-02-03 MED ORDER — FENTANYL CITRATE (PF) 100 MCG/2ML IJ SOLN: 25.0000 ug | INTRAMUSCULAR | Status: DC | PRN

## 2015-02-03 MED ORDER — LACTATED RINGERS IV SOLN
INTRAVENOUS | Status: DC
  Administered 2015-02-03: 10:00:00 via INTRAVENOUS

## 2015-02-03 MED ORDER — ONDANSETRON HCL 4 MG/2ML IJ SOLN: 4.0000 mg | Freq: Once | INTRAMUSCULAR | Status: DC | PRN

## 2015-02-03 MED ORDER — LIDOCAINE HCL (PF) 1 % IJ SOLN
INTRAMUSCULAR | Status: AC
  Filled 2015-02-03: qty 5

## 2015-02-03 MED ORDER — PROPOFOL 10 MG/ML IV BOLUS
INTRAVENOUS | Status: DC | PRN
  Administered 2015-02-03 (×3): 10 mg via INTRAVENOUS

## 2015-02-03 MED ORDER — LIDOCAINE HCL 1 % IJ SOLN
INTRAMUSCULAR | Status: DC | PRN
  Administered 2015-02-03: 50 mg via INTRADERMAL

## 2015-02-03 MED ORDER — FENTANYL CITRATE (PF) 100 MCG/2ML IJ SOLN
25.0000 ug | INTRAMUSCULAR | Status: AC
  Administered 2015-02-03 (×2): 25 ug via INTRAVENOUS
  Filled 2015-02-03: qty 2

## 2015-02-03 MED ORDER — MIDAZOLAM HCL 2 MG/2ML IJ SOLN
1.0000 mg | INTRAMUSCULAR | Status: DC | PRN
  Administered 2015-02-03 (×2): 2 mg via INTRAVENOUS
  Filled 2015-02-03 (×2): qty 2

## 2015-02-03 SURGICAL SUPPLY — 22 items
BLOCK BITE 60FR ADLT L/F BLUE (MISCELLANEOUS) ×2 IMPLANT
DEVICE CLIP HEMOSTAT 235CM (CLIP) IMPLANT
ELECT REM PT RETURN 9FT ADLT (ELECTROSURGICAL) ×2
ELECTRODE REM PT RTRN 9FT ADLT (ELECTROSURGICAL) ×1 IMPLANT
FCP BXJMBJMB 240X2.8X (CUTTING FORCEPS)
FORCEPS BIOP RAD 4 LRG CAP 4 (CUTTING FORCEPS) ×2 IMPLANT
FORCEPS BIOP RJ4 240 W/NDL (CUTTING FORCEPS)
FORCEPS BXJMBJMB 240X2.8X (CUTTING FORCEPS) IMPLANT
FORMALIN 10 PREFIL 20ML (MISCELLANEOUS) ×6 IMPLANT
INJECTOR/SNARE I SNARE (MISCELLANEOUS) IMPLANT
KIT ENDO PROCEDURE PEN (KITS) ×4 IMPLANT
MANIFOLD NEPTUNE II (INSTRUMENTS) ×2 IMPLANT
NEEDLE SCLEROTHERAPY 25GX240 (NEEDLE) IMPLANT
PROBE APC STR FIRE (PROBE) IMPLANT
PROBE INJECTION GOLD (MISCELLANEOUS)
PROBE INJECTION GOLD 7FR (MISCELLANEOUS) IMPLANT
SNARE ROTATE MED OVAL 20MM (MISCELLANEOUS) ×2 IMPLANT
SNARE SHORT THROW 13M SML OVAL (MISCELLANEOUS) IMPLANT
SYR INFLATION 60ML (SYRINGE) IMPLANT
TRAP SPECIMEN MUCOUS 40CC (MISCELLANEOUS) IMPLANT
TUBING IRRIGATION ENDOGATOR (MISCELLANEOUS) ×2 IMPLANT
WATER STERILE IRR 1000ML POUR (IV SOLUTION) ×2 IMPLANT

## 2015-02-03 NOTE — Op Note (Signed)
[facility] [address] [place], [address]   ENDOSCOPY PROCEDURE REPORT  PATIENT: Stephanie Osborne, Stephanie Osborne  MR#: [id number] BIRTHDATE: [birth year] , 49  yrs. old GENDER: female ENDOSCOPIST: R.  Garfield Cornea, MD FACP [facility] REFERRED BY:  Alford Highland, Khristine PROCEDURE DATE:  02/19/15 PROCEDURE:  EGD, diagnostic INDICATIONS:  Long-standing GERD; anemia; Hemoccult-positive stool.  MEDICATIONS: Deep sedation per Dr.  [facility] ASA CLASS:      Class II  CONSENT: The risks, benefits, limitations, alternatives and imponderables have been discussed.  The potential for biopsy, esophogeal dilation, etc. have also been reviewed.  Questions have been answered.  All parties agreeable.  Please see the history and physical in the medical record for more information.  DESCRIPTION OF PROCEDURE: After the risks benefits and alternatives of the procedure were thoroughly explained, informed consent was obtained.  The    endoscope was introduced through the mouth and advanced to the second portion of the duodenum , limited by Without limitations. The instrument was slowly withdrawn as the mucosa was fully examined. Estimated blood loss is zero unless otherwise noted in this procedure report.    Normal-appearing tubular esophagus.  Stomach empty.  2 cm hiatal hernia.  Normal-appearing gastric mucosa.  Patent pylorus. Normal-appearing first and second portion of the duodenum. Retroflexed views revealed a hiatal hernia.     The scope was then withdrawn from the patient and the procedure completed.  COMPLICATIONS: There were no immediate complications.  ENDOSCOPIC IMPRESSION: 2 cm hiatal hernia; otherwise normal EGD  RECOMMENDATIONS: Continue Prilosec 20 mg daily. See colonoscopy report.  REPEAT EXAM:  eSigned:  R. Garfield Cornea, MD Rosalita Chessman [facility] February 19, 2015 11:15 AM    CC:  CPT CODES: ICD CODES:  The ICD and CPT codes recommended by this software  are interpretations from the data that the clinical staff has captured with the software.  The verification of the translation of this report to the ICD and CPT codes and modifiers is the sole responsibility of the health care institution and practicing physician where this report was generated.  [employer]. will not be held responsible for the validity of the ICD and CPT codes included on this report.  AMA assumes no liability for data contained or not contained herein. CPT is a Designer, television/film set of the [employer].

## 2015-02-03 NOTE — Anesthesia Preprocedure Evaluation (Signed)
Anesthesia Evaluation  Patient identified by MRN, date of birth, ID band Patient awake    Reviewed: Allergy & Precautions, NPO status , Patient's Chart, lab work & pertinent test results, reviewed documented beta blocker date and time   Airway Mallampati: III  TM Distance: >3 FB   Mouth opening: Limited Mouth Opening  Dental  (+) Teeth Intact   Pulmonary asthma , Current Smoker,    breath sounds clear to auscultation       Cardiovascular hypertension, Pt. on medications and Pt. on home beta blockers + dysrhythmias (s/p ablation) Supra Ventricular Tachycardia  Rhythm:Regular Rate:Normal     Neuro/Psych Chronic insomnia    GI/Hepatic GERD  ,  Endo/Other  Hypothyroidism Morbid obesity  Renal/GU      Musculoskeletal  (+) Arthritis , Fibromyalgia -  Abdominal   Peds  Hematology  (+) anemia ,   Anesthesia Other Findings   Reproductive/Obstetrics                             Anesthesia Physical Anesthesia Plan  ASA: III  Anesthesia Plan: MAC   Post-op Pain Management:    Induction: Intravenous  Airway Management Planned: Simple Face Mask  Additional Equipment:   Intra-op Plan:   Post-operative Plan:   Informed Consent: I have reviewed the patients History and Physical, chart, labs and discussed the procedure including the risks, benefits and alternatives for the proposed anesthesia with the patient or authorized representative who has indicated his/her understanding and acceptance.     Plan Discussed with:   Anesthesia Plan Comments:         Anesthesia Quick Evaluation

## 2015-02-03 NOTE — Op Note (Signed)
[facility] [address] [name], [address]   COLONOSCOPY PROCEDURE REPORT  PATIENT: Stephanie Osborne, Stephanie Osborne  MR#: [id number] BIRTHDATE: 15-Oct-1965 , 49  yrs. old GENDER: female ENDOSCOPIST: R.  Garfield Cornea, MD FACP FACG REFERRED BY: Izola Price. [email or web address] PROCEDURE DATE:  Feb 15, 2015 PROCEDURE:   Colonoscopy with snare polypectomy and with biopsy INDICATIONS:anemia; Hemoccult-positive stool first ever colonoscopy.  MEDICATIONS: Deep Sedation per Dr[facility]  [facility] ASA CLASS:       Class II  CONSENT: The risks, benefits, alternatives and imponderables including but not limited to bleeding, perforation as well as the possibility of a missed lesion have been reviewed.  The potential for biopsy, lesion removal, etc. have also been discussed. Questions have been answered.  All parties agreeable.  Please see the history and physical in the medical record for more information.  DESCRIPTION OF PROCEDURE:   After the risks benefits and alternatives of the procedure were thoroughly explained, informed consent was obtained.  The digital rectal exam revealed no abnormalities of the rectum.   The     endoscope was introduced through the anus and advanced to the cecum, which was identified by both the appendix and ileocecal valve. No adverse events experienced.   The quality of the prep was     The instrument was then slowly withdrawn as the colon was fully examined. Estimated blood loss is zero unless otherwise noted in this procedure report.       COLON FINDINGS: Normal-appearing rectal mucosa.  Scattered left-sided diverticula; multiple polyps in the cecum, ascending and descending segments.  The largest polyp being 1 cm in size.  Multiple hot and cold snare polypectomies performed.  The single diminutive polyp in the cecum was cold biopsy/removed. Retroflexion was performed. .  Withdrawal time=15 minutes 0 seconds.  The scope was withdrawn and the procedure  completed. COMPLICATIONS: There were no immediate complications.  ENDOSCOPIC IMPRESSION: Multiple colonic polyps?"removed as described above.  Colonic diverticulosis.  RECOMMENDATIONS: Follow-up pathology. See EGD report.  eSigned:  R. Garfield Cornea, MD Rosalita Chessman [facility] 02/15/15 11:21 AM   cc:  CPT CODES: ICD CODES:  The ICD and CPT codes recommended by this software are interpretations from the data that the clinical staff has captured with the software.  The verification of the translation of this report to the ICD and CPT codes and modifiers is the sole responsibility of the health care institution and practicing physician where this report was generated.  [employer]. will not be held responsible for the validity of the ICD and CPT codes included on this report.  AMA assumes no liability for data contained or not contained herein. CPT is a Designer, television/film set of the [employer].  PATIENT NAME:  [name], [name] MR#: [id number]

## 2015-02-03 NOTE — Discharge Instructions (Signed)
Colonoscopy Discharge Instructions  Read the instructions outlined below and refer to this sheet in the next few weeks. These discharge instructions provide you with general information on caring for yourself after you leave the hospital. Your doctor may also give you specific instructions. While your treatment has been planned according to the most current medical practices available, unavoidable complications occasionally occur. If you have any problems or questions after discharge, call Dr. Gala Romney at [phone]. ACTIVITY  You may resume your regular activity, but move at a slower pace for the next 24 hours.   Take frequent rest periods for the next 24 hours.   Walking will help get rid of the air and reduce the bloated feeling in your belly (abdomen).   No driving for 24 hours (because of the medicine (anesthesia) used during the test).    Do not sign any important legal documents or operate any machinery for 24 hours (because of the anesthesia used during the test).  NUTRITION  Drink plenty of fluids.   You may resume your normal diet as instructed by your doctor.   Begin with a light meal and progress to your normal diet. Heavy or fried foods are harder to digest and may make you feel sick to your stomach (nauseated).   Avoid alcoholic beverages for 24 hours or as instructed.  MEDICATIONS  You may resume your normal medications unless your doctor tells you otherwise.  WHAT YOU CAN EXPECT TODAY  Some feelings of bloating in the abdomen.   Passage of more gas than usual.   Spotting of blood in your stool or on the toilet paper.  IF YOU HAD POLYPS REMOVED DURING THE COLONOSCOPY:  No aspirin products for 7 days or as instructed.   No alcohol for 7 days or as instructed.   Eat a soft diet for the next 24 hours.  FINDING OUT THE RESULTS OF YOUR TEST Not all test results are available during your visit. If your test results are not back during the visit, make an appointment  with your caregiver to find out the results. Do not assume everything is normal if you have not heard from your caregiver or the medical facility. It is important for you to follow up on all of your test results.  SEEK IMMEDIATE MEDICAL ATTENTION IF:  You have more than a spotting of blood in your stool.   Your belly is swollen (abdominal distention).   You are nauseated or vomiting.   You have a temperature over 101.  You have abdominal pain or discomfort that is severe or gets worse throughout the day. EGD Discharge instructions Please read the instructions outlined below and refer to this sheet in the next few weeks. These discharge instructions provide you with general information on caring for yourself after you leave the hospital. Your doctor may also give you specific instructions. While your treatment has been planned according to the most current medical practices available, unavoidable complications occasionally occur. If you have any problems or questions after discharge, please call your doctor. ACTIVITY You may resume your regular activity but move at a slower pace for the next 24 hours.  Take frequent rest periods for the next 24 hours.  Walking will help expel (get rid of) the air and reduce the bloated feeling in your abdomen.  No driving for 24 hours (because of the anesthesia (medicine) used during the test).  You may shower.  Do not sign any important legal documents or operate any machinery for 24  hours (because of the anesthesia used during the test).  NUTRITION Drink plenty of fluids.  You may resume your normal diet.  Begin with a light meal and progress to your normal diet.  Avoid alcoholic beverages for 24 hours or as instructed by your caregiver.  MEDICATIONS You may resume your normal medications unless your caregiver tells you otherwise.  WHAT YOU CAN EXPECT TODAY You may experience abdominal discomfort such as a feeling of fullness or gas pains.   FOLLOW-UP Your doctor will discuss the results of your test with you.  SEEK IMMEDIATE MEDICAL ATTENTION IF ANY OF THE FOLLOWING OCCUR: Excessive nausea (feeling sick to your stomach) and/or vomiting.  Severe abdominal pain and distention (swelling).  Trouble swallowing.  Temperature over 101 F (37.8 C).  Rectal bleeding or vomiting of blood.    Hiatal hernia and colon polyp information provided  Diverticulosis information provided  Further recommendations to follow pending review of pathology report  Colon Polyps Polyps are lumps of extra tissue growing inside the body. Polyps can grow in the large intestine (colon). Most colon polyps are noncancerous (benign). However, some colon polyps can become cancerous over time. Polyps that are larger than a pea may be harmful. To be safe, caregivers remove and test all polyps. CAUSES  Polyps form when mutations in the genes cause your cells to grow and divide even though no more tissue is needed. RISK FACTORS There are a number of risk factors that can increase your chances of getting colon polyps. They include:  Being older than 50 years.  Family history of colon polyps or colon cancer.  Long-term colon diseases, such as colitis or Crohn disease.  Being overweight.  Smoking.  Being inactive.  Drinking too much alcohol. SYMPTOMS  Most small polyps do not cause symptoms. If symptoms are present, they may include:  Blood in the stool. The stool may look dark red or black.  Constipation or diarrhea that lasts longer than 1 week. DIAGNOSIS People often do not know they have polyps until their caregiver finds them during a regular checkup. Your caregiver can use 4 tests to check for polyps:  Digital rectal exam. The caregiver wears gloves and feels inside the rectum. This test would find polyps only in the rectum.  Barium enema. The caregiver puts a liquid called barium into your rectum before taking X-rays of your colon. Barium  makes your colon look white. Polyps are dark, so they are easy to see in the X-ray pictures.  Sigmoidoscopy. A thin, flexible tube (sigmoidoscope) is placed into your rectum. The sigmoidoscope has a light and tiny camera in it. The caregiver uses the sigmoidoscope to look at the last third of your colon.  Colonoscopy. This test is like sigmoidoscopy, but the caregiver looks at the entire colon. This is the most common method for finding and removing polyps. TREATMENT  Any polyps will be removed during a sigmoidoscopy or colonoscopy. The polyps are then tested for cancer. PREVENTION  To help lower your risk of getting more colon polyps:  Eat plenty of fruits and vegetables. Avoid eating fatty foods.  Do not smoke.  Avoid drinking alcohol.  Exercise every day.  Lose weight if recommended by your caregiver.  Eat plenty of calcium and folate. Foods that are rich in calcium include milk, cheese, and broccoli. Foods that are rich in folate include chickpeas, kidney beans, and spinach. HOME CARE INSTRUCTIONS Keep all follow-up appointments as directed by your caregiver. You may need periodic exams to check  for polyps. SEEK MEDICAL CARE IF: You notice bleeding during a bowel movement. Document Released: 01/25/2004 Document Revised: 07/23/2011 Document Reviewed: 07/10/2011 [facility] Patient Information 2015 [place], [place]. This information is not intended to replace advice given to you by your health care provider. Make sure you discuss any questions you have with your health care provider.  Hiatal Hernia A hiatal hernia occurs when part of your stomach slides above the muscle that separates your abdomen from your chest (diaphragm). You can be born with a hiatal hernia (congenital), or it may develop over time. In almost all cases of hiatal hernia, only the top part of the stomach pushes through.  Many people have a hiatal hernia with no symptoms. The larger the hernia, the more likely that you  will have symptoms. In some cases, a hiatal hernia allows stomach acid to flow back into the tube that carries food from your mouth to your stomach (esophagus). This may cause heartburn symptoms. Severe heartburn symptoms may mean you have developed a condition called gastroesophageal reflux disease (GERD).  CAUSES  Hiatal hernias are caused by a weakness in the opening (hiatus) where your esophagus passes through your diaphragm to attach to the upper part of your stomach. You may be born with a weakness in your hiatus, or a weakness can develop. RISK FACTORS Older age is a major risk factor for a hiatal hernia. Anything that increases pressure on your diaphragm can also increase your risk of a hiatal hernia. This includes: Pregnancy. Excess weight. Frequent constipation. SIGNS AND SYMPTOMS  People with a hiatal hernia often have no symptoms. If symptoms develop, they are almost always caused by GERD. They may include: Heartburn. Belching. Indigestion. Trouble swallowing. Coughing or wheezing. Sore throat. Hoarseness. Chest pain. DIAGNOSIS  A hiatal hernia is sometimes found during an exam for another problem. Your health care provider may suspect a hiatal hernia if you have symptoms of GERD. Tests may be done to diagnose GERD. These may include: X-rays of your stomach or chest. An upper gastrointestinal (GI) series. This is an X-ray exam of your GI tract involving the use of a chalky liquid that you swallow. The liquid shows up clearly on the X-ray. Endoscopy. This is a procedure to look into your stomach using a thin, flexible tube that has a tiny camera and light on the end of it. TREATMENT  If you have no symptoms, you may not need treatment. If you have symptoms, treatment may include: Dietary and lifestyle changes to help reduce GERD symptoms. Medicines. These may include: Over-the-counter antacids. Medicines that make your stomach empty more quickly. Medicines that block the  production of stomach acid (H2 blockers). Stronger medicines to reduce stomach acid (proton pump inhibitors). You may need surgery to repair the hernia if other treatments are not helping. HOME CARE INSTRUCTIONS  Take all medicines as directed by your health care provider. Quit smoking, if you smoke. Try to achieve and maintain a healthy body weight. Eat frequent small meals instead of three large meals a day. This keeps your stomach from getting too full. Eat slowly. Do not lie down right after eating. Do noteat 1-2 hours before bed.  Do not drink beverages with caffeine. These include cola, coffee, cocoa, and tea. Do not drink alcohol. Avoid foods that can make symptoms of GERD worse. These may include: Fatty foods. Citrus fruits. Other foods and drinks that contain acid. Avoid putting pressure on your belly. Anything that puts pressure on your belly increases the amount of  acid that may be pushed up into your esophagus.  Avoid bending over, especially after eating. Raise the head of your bed by putting blocks under the legs. This keeps your head and esophagus higher than your stomach. Do not wear tight clothing around your chest or stomach. Try not to strain when having a bowel movement, when urinating, or when lifting heavy objects. SEEK MEDICAL CARE IF: Your symptoms are not controlled with medicines or lifestyle changes. You are having trouble swallowing. You have coughing or wheezing that will not go away. SEEK IMMEDIATE MEDICAL CARE IF: Your pain is getting worse. Your pain spreads to your arms, neck, jaw, teeth, or back. You have shortness of breath. You sweat for no reason. You feel sick to your stomach (nauseous) or vomit. You vomit blood. You have bright red blood in your stools. You have black, tarry stools.  Document Released: 07/21/2003 Document Revised: 09/14/2013 Document Reviewed: 04/17/2013 [facility] Patient Information 2015 [place], [place]. This information  is not intended to replace advice given to you by your health care provider. Make sure you discuss any questions you have with your health care provider.  Diverticulosis Diverticulosis is the condition that develops when small pouches (diverticula) form in the wall of your colon. Your colon, or large intestine, is where water is absorbed and stool is formed. The pouches form when the inside layer of your colon pushes through weak spots in the outer layers of your colon. CAUSES  No one knows exactly what causes diverticulosis. RISK FACTORS  Being older than 50. Your risk for this condition increases with age. Diverticulosis is rare in people younger than 40 years. By age 15, almost everyone has it.  Eating a low-fiber diet.  Being frequently constipated.  Being overweight.  Not getting enough exercise.  Smoking.  Taking over-the-counter pain medicines, like aspirin and ibuprofen. SYMPTOMS  Most people with diverticulosis do not have symptoms. DIAGNOSIS  Because diverticulosis often has no symptoms, health care providers often discover the condition during an exam for other colon problems. In many cases, a health care provider will diagnose diverticulosis while using a flexible scope to examine the colon (colonoscopy). TREATMENT  If you have never developed an infection related to diverticulosis, you may not need treatment. If you have had an infection before, treatment may include:  Eating more fruits, vegetables, and grains.  Taking a fiber supplement.  Taking a live bacteria supplement (probiotic).  Taking medicine to relax your colon. HOME CARE INSTRUCTIONS   Drink at least 6-8 glasses of water each day to prevent constipation.  Try not to strain when you have a bowel movement.  Keep all follow-up appointments. If you have had an infection before:  Increase the fiber in your diet as directed by your health care provider or dietitian.  Take a dietary fiber supplement  if your health care provider approves.  Only take medicines as directed by your health care provider. SEEK MEDICAL CARE IF:   You have abdominal pain.  You have bloating.  You have cramps.  You have not gone to the bathroom in 3 days. SEEK IMMEDIATE MEDICAL CARE IF:   Your pain gets worse.  Yourbloating becomes very bad.  You have a fever or chills, and your symptoms suddenly get worse.  You begin vomiting.  You have bowel movements that are bloody or black. MAKE SURE YOU:  Understand these instructions.  Will watch your condition.  Will get help right away if you are not doing well or get  worse. Document Released: 01/26/2004 Document Revised: 05/05/2013 Document Reviewed: 03/25/2013 [facility] Patient Information 2015 [place], [place]. This information is not intended to replace advice given to you by your health care provider. Make sure you discuss any questions you have with your health care provider.  Esophagogastroduodenoscopy Care After Refer to this sheet in the next few weeks. These instructions provide you with information on caring for yourself after your procedure. Your caregiver may also give you more specific instructions. Your treatment has been planned according to current medical practices, but problems sometimes occur. Call your caregiver if you have any problems or questions after your procedure.  HOME CARE INSTRUCTIONS  Do not eat or drink anything until the numbing medicine (local anesthetic) has worn off and your gag reflex has returned. You will know that the local anesthetic has worn off when you can swallow comfortably.  Do not drive for 12 hours after the procedure or as directed by your caregiver.  Only take medicines as directed by your caregiver. SEEK MEDICAL CARE IF:   You cannot stop coughing.  You are not urinating at all or less than usual. SEEK IMMEDIATE MEDICAL CARE IF:  You have difficulty swallowing.  You cannot eat or  drink.  You have worsening throat or chest pain.  You have dizziness, lightheadedness, or you faint.  You have nausea or vomiting.  You have chills.  You have a fever.  You have severe abdominal pain.  You have black, tarry, or bloody stools. Document Released: 04/16/2012 Document Reviewed: 04/16/2012 [facility] Patient Information 2015 [place]. This information is not intended to replace advice given to you by your health care provider. Make sure you discuss any questions you have with your health care provider.

## 2015-02-03 NOTE — H&P (View-Only) (Signed)
Primary Care Physician:  Wendie Simmer, MD  Primary Gastroenterologist:  Garfield Cornea, MD   Chief Complaint  Patient presents with  . Anemia  . heme + stool sample    HPI:  Stephanie Osborne is a 49 y.o. female here st the request of Wendie Simmer, NP for further evaluation of anemia, heme + stool. Feels well from GI standpoint. Used to take ibuprofen for chronic back pain but quit once she was found to have anemia and blood in stool. Denies constipation, diarrhea, melena, brbpr. No heartburn on PPI. Chronic gerd for years. No dysphagia, vomiting, anorexia. No prior EGD/TCS.  Current Outpatient Prescriptions  Medication Sig Dispense Refill  . Acetaminophen-Caff-Pyrilamine (MENSTRUAL COMPLETE) 500-60-15 MG TABS Take 1 tablet by mouth daily as needed (for pain).    Marland Kitchen albuterol (PROVENTIL HFA;VENTOLIN HFA) 108 (90 BASE) MCG/ACT inhaler Inhale into the lungs every 6 (six) hours as needed for wheezing or shortness of breath.    Marland Kitchen gemfibrozil (LOPID) 600 MG tablet   11  . levothyroxine (SYNTHROID, LEVOTHROID) 125 MCG tablet Take 125 mcg by mouth daily before breakfast.    . lisinopril-hydrochlorothiazide (PRINZIDE,ZESTORETIC) 20-12.5 MG per tablet Take 1 tablet by mouth 2 (two) times daily.    . metoprolol succinate (TOPROL-XL) 25 MG 24 hr tablet   1  . montelukast (SINGULAIR) 10 MG tablet   11  . omeprazole (PRILOSEC) 20 MG capsule Take 20 mg by mouth 2 (two) times daily before a meal.     . PARoxetine (PAXIL) 20 MG tablet Take 20 mg by mouth daily.    Marland Kitchen rOPINIRole (REQUIP) 4 MG tablet Take 4 mg by mouth at bedtime.     No current facility-administered medications for this visit.    Allergies as of 01/13/2015 - Review Complete 01/13/2015  Allergen Reaction Noted  . Adalat [nifedipine]  11/03/2013  . Latex Itching 11/03/2013  . Propranolol Swelling 01/13/2015  . Ampicillin Rash 01/29/2014    Past Medical History  Diagnosis Date  . Asthma   . Arthritis   . SVT (supraventricular  tachycardia)     s/p ablation 2006  . Fibromyalgia   . Hypertension   . Thyroid disease   . Psoriatic arthritis     Past Surgical History  Procedure Laterality Date  . Cesarean section    . Appendectomy    . Laparoscopic ovarian      left  . Tonsillectomy      Family History  Problem Relation Age of Onset  . Lung cancer Mother   . Colon cancer Neg Hx     none known    Social History   Social History  . Marital Status: Divorced    Spouse Name: N/A  . Number of Children: 3  . Years of Education: N/A   Occupational History  . Not on file.   Social History Main Topics  . Smoking status: Current Every Day Smoker -- 0.50 packs/day    Types: Cigarettes  . Smokeless tobacco: Not on file  . Alcohol Use: No  . Drug Use: No  . Sexual Activity: Yes    Birth Control/ Protection: None   Other Topics Concern  . Not on file   Social History Narrative      ROS:  General: Negative for anorexia, weight loss, fever, chills, fatigue, weakness. Eyes: Negative for vision changes.  ENT: Negative for hoarseness, difficulty swallowing , nasal congestion. CV: Negative for chest pain, angina, palpitations, dyspnea on exertion, peripheral edema.  Respiratory: Negative for dyspnea  at rest, dyspnea on exertion, cough, sputum, wheezing.  GI: See history of present illness. GU:  Negative for dysuria, hematuria, urinary incontinence, urinary frequency, nocturnal urination.  MS: Negative for joint pain, +chronic low back pain.  Derm: Negative for rash or itching.  Neuro: Negative for weakness, abnormal sensation, seizure, frequent headaches, memory loss, confusion.  Psych: Negative for anxiety, depression, suicidal ideation, hallucinations.  Endo: Negative for unusual weight change.  Heme: Negative for bruising or bleeding. Allergy: Negative for rash or hives.    Physical Examination:  BP 154/92 mmHg  Pulse 81  Temp(Src) 99 F (37.2 C)  Ht 5\' 4"  (1.626 m)  Wt 218 lb 3.2 oz  (98.975 kg)  BMI 37.44 kg/m2  LMP 01/11/2015 (Approximate)   General: Well-nourished, well-developed in no acute distress.  Head: Normocephalic, atraumatic.   Eyes: Conjunctiva pink, no icterus. Mouth: Oropharyngeal mucosa moist and pink , no lesions erythema or exudate. Neck: Supple without thyromegaly, masses, or lymphadenopathy.  Lungs: Clear to auscultation bilaterally.  Heart: Regular rate and rhythm, no murmurs rubs or gallops.  Abdomen: Bowel sounds are normal, nontender, nondistended, no hepatosplenomegaly or masses, no abdominal bruits or    hernia , no rebound or guarding.   Rectal: not performed Extremities: No lower extremity edema. No clubbing or deformities.  Neuro: Alert and oriented x 4 , grossly normal neurologically.  Skin: Warm and dry, no rash or jaundice.   Psych: Alert and cooperative, normal mood and affect.  Labs: 09/2014 WBC 7100, H/H 11.4/34.6, MCV 79, Platelets 203, iron 36, TIBC 522, sat 7%, ferritin 8, tbili 0.4, AP 45, AST 15, ALT 10, alb 3.9, cre 0.74  05/2014 H/H 13.3/39.3  Imaging Studies: No results found.

## 2015-02-03 NOTE — Transfer of Care (Signed)
Immediate Anesthesia Transfer of Care Note  Patient: Stephanie Osborne  Procedure(s) Performed: Procedure(s) with comments: COLONOSCOPY WITH PROPOFOL (N/A) - Cecum time in 1051  time out 1107   total time 16 minutes ESOPHAGOGASTRODUODENOSCOPY (EGD) WITH PROPOFOL (N/A) - procedure 1 POLYPECTOMY (N/A) - Cecal, Ascending Colon, Desceding,   Patient Location: PACU  Anesthesia Type:MAC  Level of Consciousness: awake, alert  and oriented  Airway & Oxygen Therapy: Patient Spontanous Breathing and Patient connected to nasal cannula oxygen  Post-op Assessment: Report given to RN  Post vital signs: Reviewed and stable  Last Vitals:  Filed Vitals:   02/03/15 1020  BP: 138/82  Resp: 0    Complications: No apparent anesthesia complications

## 2015-02-03 NOTE — Anesthesia Procedure Notes (Signed)
Procedure Name: MAC Date/Time: 02/03/2015 10:24 AM Performed by: Vista Deck Pre-anesthesia Checklist: Patient identified, Emergency Drugs available, Suction available, Timeout performed and Patient being monitored Patient Re-evaluated:Patient Re-evaluated prior to inductionOxygen Delivery Method: Non-rebreather mask

## 2015-02-03 NOTE — Interval H&P Note (Signed)
History and Physical Interval Note:  02/03/2015 10:15 AM  Stephanie Osborne  has presented today for surgery, with the diagnosis of IDA/GERD/BLOOD IN STOOL  The various methods of treatment have been discussed with the patient and family. After consideration of risks, benefits and other options for treatment, the patient has consented to  Procedure(s) with comments: COLONOSCOPY WITH PROPOFOL (N/A) - 1015 ESOPHAGOGASTRODUODENOSCOPY (EGD) WITH PROPOFOL (N/A) as a surgical intervention .  The patient's history has been reviewed, patient examined, no change in status, stable for surgery.  I have reviewed the patient's chart and labs.  Questions were answered to the patient's satisfaction.     Robert Rourk  No change - EGD and tcs; pt denies dysphagia; The risks, benefits, limitations, alternatives and imponderables have been reviewed with the patient. Questions have been answered. All parties are agreeable. The risks, benefits, limitations, alternatives and imponderables have been reviewed with the patient. Potential for esophageal dilation, biopsy, etc. have also been reviewed.  Questions have been answered. All parties agreeable.

## 2015-02-04 ENCOUNTER — Encounter (HOSPITAL_COMMUNITY): Payer: Self-pay | Admitting: Internal Medicine

## 2015-02-04 NOTE — Anesthesia Postprocedure Evaluation (Signed)
  Anesthesia Post-op Note  Patient: Stephanie Osborne  Procedure(s) Performed: Procedure(s) with comments: COLONOSCOPY WITH PROPOFOL (N/A) - Cecum time in 1051  time out 1107   total time 16 minutes ESOPHAGOGASTRODUODENOSCOPY (EGD) WITH PROPOFOL (N/A) - procedure 1 POLYPECTOMY (N/A) - Cecal, Ascending Colon, Desceding,   Patient Location: PACU  Anesthesia Type:MAC  Level of Consciousness: awake, alert  and oriented  Airway and Oxygen Therapy: Patient Spontanous Breathing  Post-op Pain: none  Post-op Assessment: Post-op Vital signs reviewed, Patient's Cardiovascular Status Stable, Respiratory Function Stable, Patent Airway and No signs of Nausea or vomiting              Post-op Vital Signs: Reviewed and stable  Last Vitals:  Filed Vitals:   02/03/15 1147  BP: 141/83  Pulse: 65  Temp: 36.5 C  Resp: 20    Complications: No apparent anesthesia complications, Late entry, patient seen in pacu prior to be moving to short stay.

## 2015-02-05 ENCOUNTER — Encounter: Payer: Self-pay | Admitting: Internal Medicine

## 2015-02-22 ENCOUNTER — Encounter: Payer: Medicaid Other | Admitting: Cardiology

## 2015-02-22 ENCOUNTER — Encounter: Payer: Self-pay | Admitting: Cardiology

## 2015-02-22 NOTE — Progress Notes (Signed)
Patient ID: Stephanie Osborne, female   DOB: February 10, 1966, 49 y.o.   MRN: [id number]     Clinical Summary Stephanie Osborne is a 49 y.o.female seen today as a new patient for the following medical problems.  1. Chest pain    Past Medical History  Diagnosis Date  . Asthma   . Arthritis   . SVT (supraventricular tachycardia)     s/p ablation 2006  . Fibromyalgia   . Hypertension   . Thyroid disease   . Psoriatic arthritis   . Chronic back pain      Allergies  Allergen Reactions  . Adalat [Nifedipine]     Aggravates the SVT  . Latex Itching  . Propranolol Swelling  . Ampicillin Rash     Current Outpatient Prescriptions  Medication Sig Dispense Refill  . Acetaminophen-Caff-Pyrilamine (MENSTRUAL COMPLETE) 500-60-15 MG TABS Take 1 tablet by mouth daily as needed (for pain).    Marland Kitchen albuterol (PROVENTIL HFA;VENTOLIN HFA) 108 (90 BASE) MCG/ACT inhaler Inhale into the lungs every 6 (six) hours as needed for wheezing or shortness of breath.    . ferrous sulfate 325 (65 FE) MG tablet Take 1 tablet (325 mg total) by mouth 2 (two) times daily with a meal. 60 tablet 3  . gemfibrozil (LOPID) 600 MG tablet Take 600 mg by mouth 2 (two) times daily before a meal.   11  . levothyroxine (SYNTHROID, LEVOTHROID) 125 MCG tablet Take 125 mcg by mouth daily before breakfast.    . lisinopril-hydrochlorothiazide (PRINZIDE,ZESTORETIC) 20-12.5 MG per tablet Take 1 tablet by mouth 2 (two) times daily.    . metoprolol succinate (TOPROL-XL) 25 MG 24 hr tablet Take 25 mg by mouth daily.   1  . montelukast (SINGULAIR) 10 MG tablet Take 10 mg by mouth at bedtime.   11  . omeprazole (PRILOSEC) 20 MG capsule Take 20 mg by mouth 2 (two) times daily before a meal.     . PARoxetine (PAXIL) 20 MG tablet Take 20 mg by mouth daily.    . polyethylene glycol-electrolytes (TRILYTE) 420 G solution Take 4,000 mLs by mouth as directed. 4000 mL 0  . rOPINIRole (REQUIP) 4 MG tablet Take 4 mg by mouth at bedtime.     No current  facility-administered medications for this visit.     Past Surgical History  Procedure Laterality Date  . Cesarean section    . Appendectomy    . Laparoscopic ovarian      left  . Tonsillectomy    . Colonoscopy with propofol N/A 02/03/2015    Procedure: COLONOSCOPY WITH PROPOFOL;  Surgeon: Daneil Dolin, MD;  Location: [facility];  Service: Endoscopy;  Laterality: N/A;  Cecum time in 1051  time out 1107   total time 16 minutes  . Esophagogastroduodenoscopy (egd) with propofol N/A 02/03/2015    Procedure: ESOPHAGOGASTRODUODENOSCOPY (EGD) WITH PROPOFOL;  Surgeon: Daneil Dolin, MD;  Location: [facility];  Service: Endoscopy;  Laterality: N/A;  procedure 1  . Polypectomy N/A 02/03/2015    Procedure: POLYPECTOMY;  Surgeon: Daneil Dolin, MD;  Location: [facility];  Service: Endoscopy;  Laterality: N/A;  Cecal, Ascending Colon, Desceding,      Allergies  Allergen Reactions  . Adalat [Nifedipine]     Aggravates the SVT  . Latex Itching  . Propranolol Swelling  . Ampicillin Rash      Family History  Problem Relation Age of Onset  . Lung cancer Mother   . Colon cancer Neg Hx     none  known     Social History Stephanie Osborne reports that she has been smoking Cigarettes.  She has been smoking about 0.50 packs per day. She does not have any smokeless tobacco history on file. Stephanie Osborne reports that she does not drink alcohol.   Review of Systems CONSTITUTIONAL: No weight loss, fever, chills, weakness or fatigue.  HEENT: Eyes: No visual loss, blurred vision, double vision or yellow sclerae.No hearing loss, sneezing, congestion, runny nose or sore throat.  SKIN: No rash or itching.  CARDIOVASCULAR:  RESPIRATORY: No shortness of breath, cough or sputum.  GASTROINTESTINAL: No anorexia, nausea, vomiting or diarrhea. No abdominal pain or blood.  GENITOURINARY: No burning on urination, no polyuria NEUROLOGICAL: No headache, dizziness, syncope, paralysis, ataxia, numbness or tingling in the  extremities. No change in bowel or bladder control.  MUSCULOSKELETAL: No muscle, back pain, joint pain or stiffness.  LYMPHATICS: No enlarged nodes. No history of splenectomy.  PSYCHIATRIC: No history of depression or anxiety.  ENDOCRINOLOGIC: No reports of sweating, cold or heat intolerance. No polyuria or polydipsia.  Marland Kitchen   Physical Examination There were no vitals filed for this visit. There were no vitals filed for this visit.  Gen: resting comfortably, no acute distress HEENT: no scleral icterus, pupils equal round and reactive, no palptable cervical adenopathy,  CV Resp: Clear to auscultation bilaterally GI: abdomen is soft, non-tender, non-distended, normal bowel sounds, no hepatosplenomegaly MSK: extremities are warm, no edema.  Skin: warm, no rash Neuro:  no focal deficits Psych: appropriate affect   Diagnostic Studies     Assessment and Plan        Arnoldo Lenis, M.D., F.A.C.C.

## 2015-12-30 IMAGING — CR DG CHEST 2V
2 series · 2 of 2 positions shown · non-contrast
Comparison: None.

CLINICAL DATA: Weakness, dizziness, nausea, history of
supraventricular tachycardia

EXAM:
CHEST  2 VIEW

[view not recorded (1 of 2)]
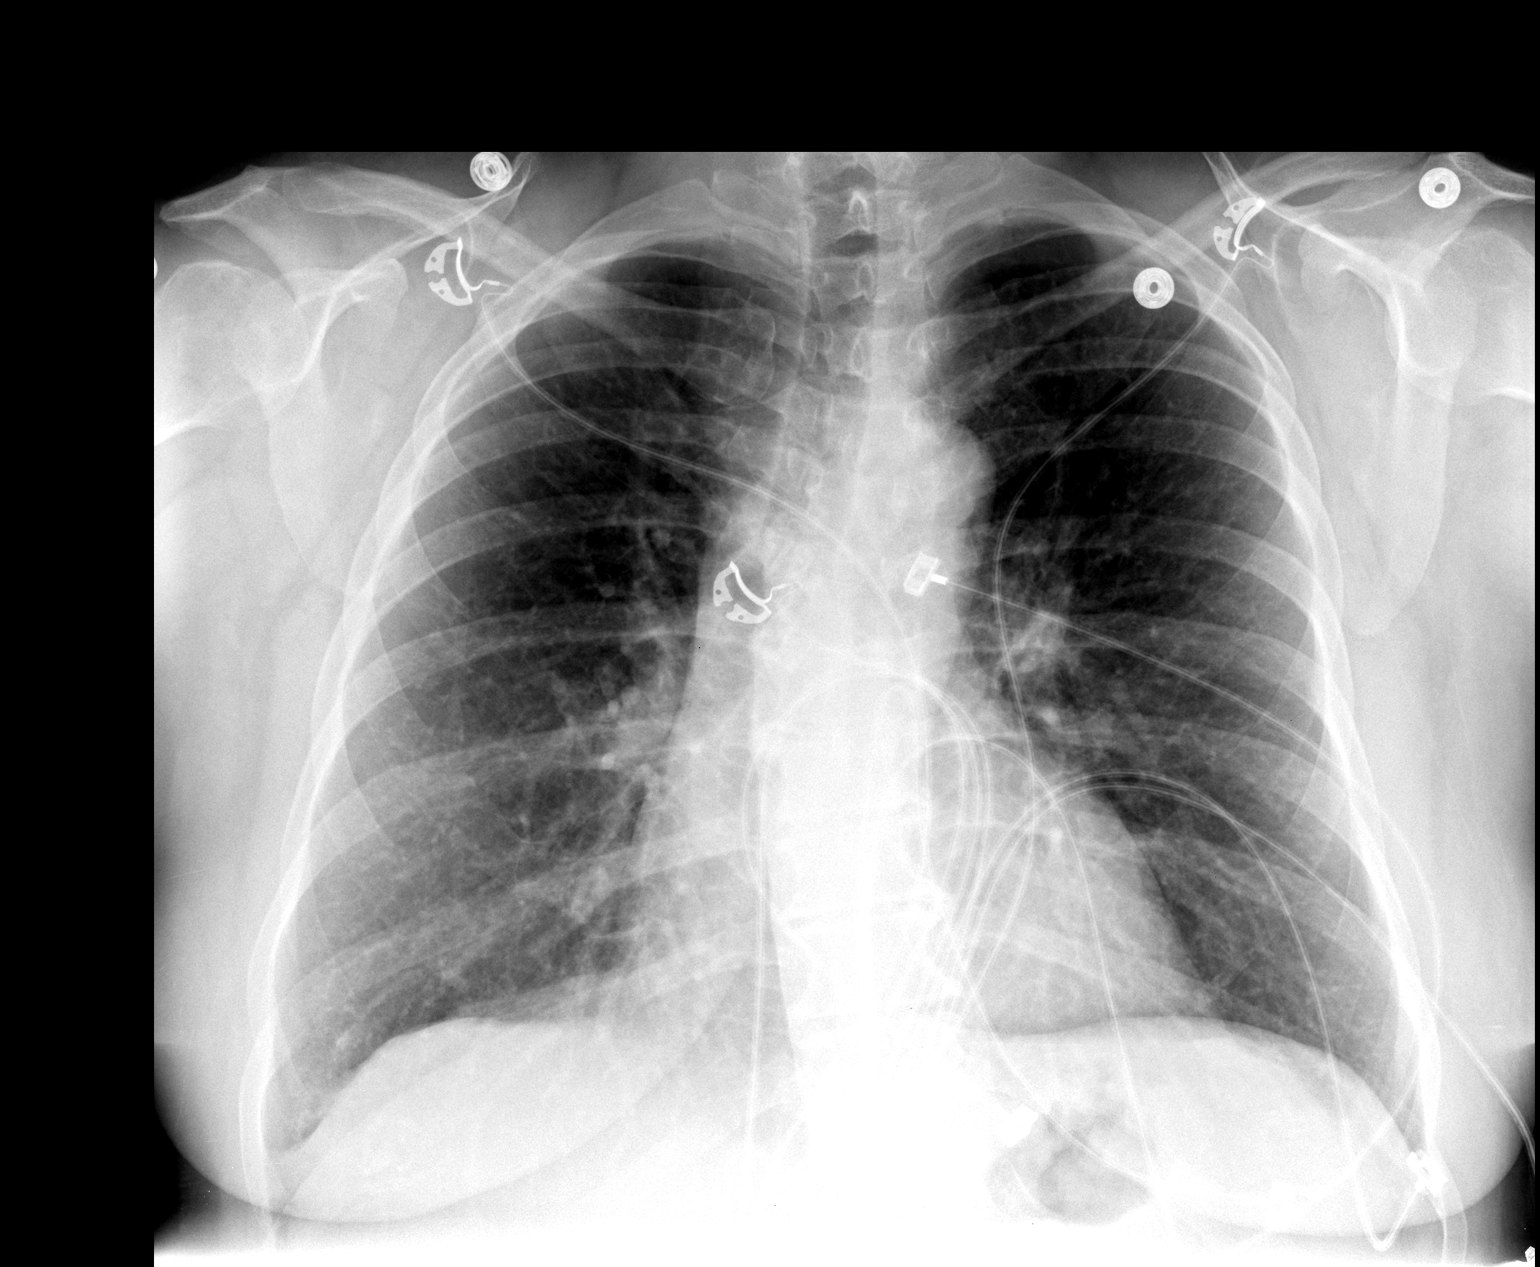

[view not recorded (2 of 2)]
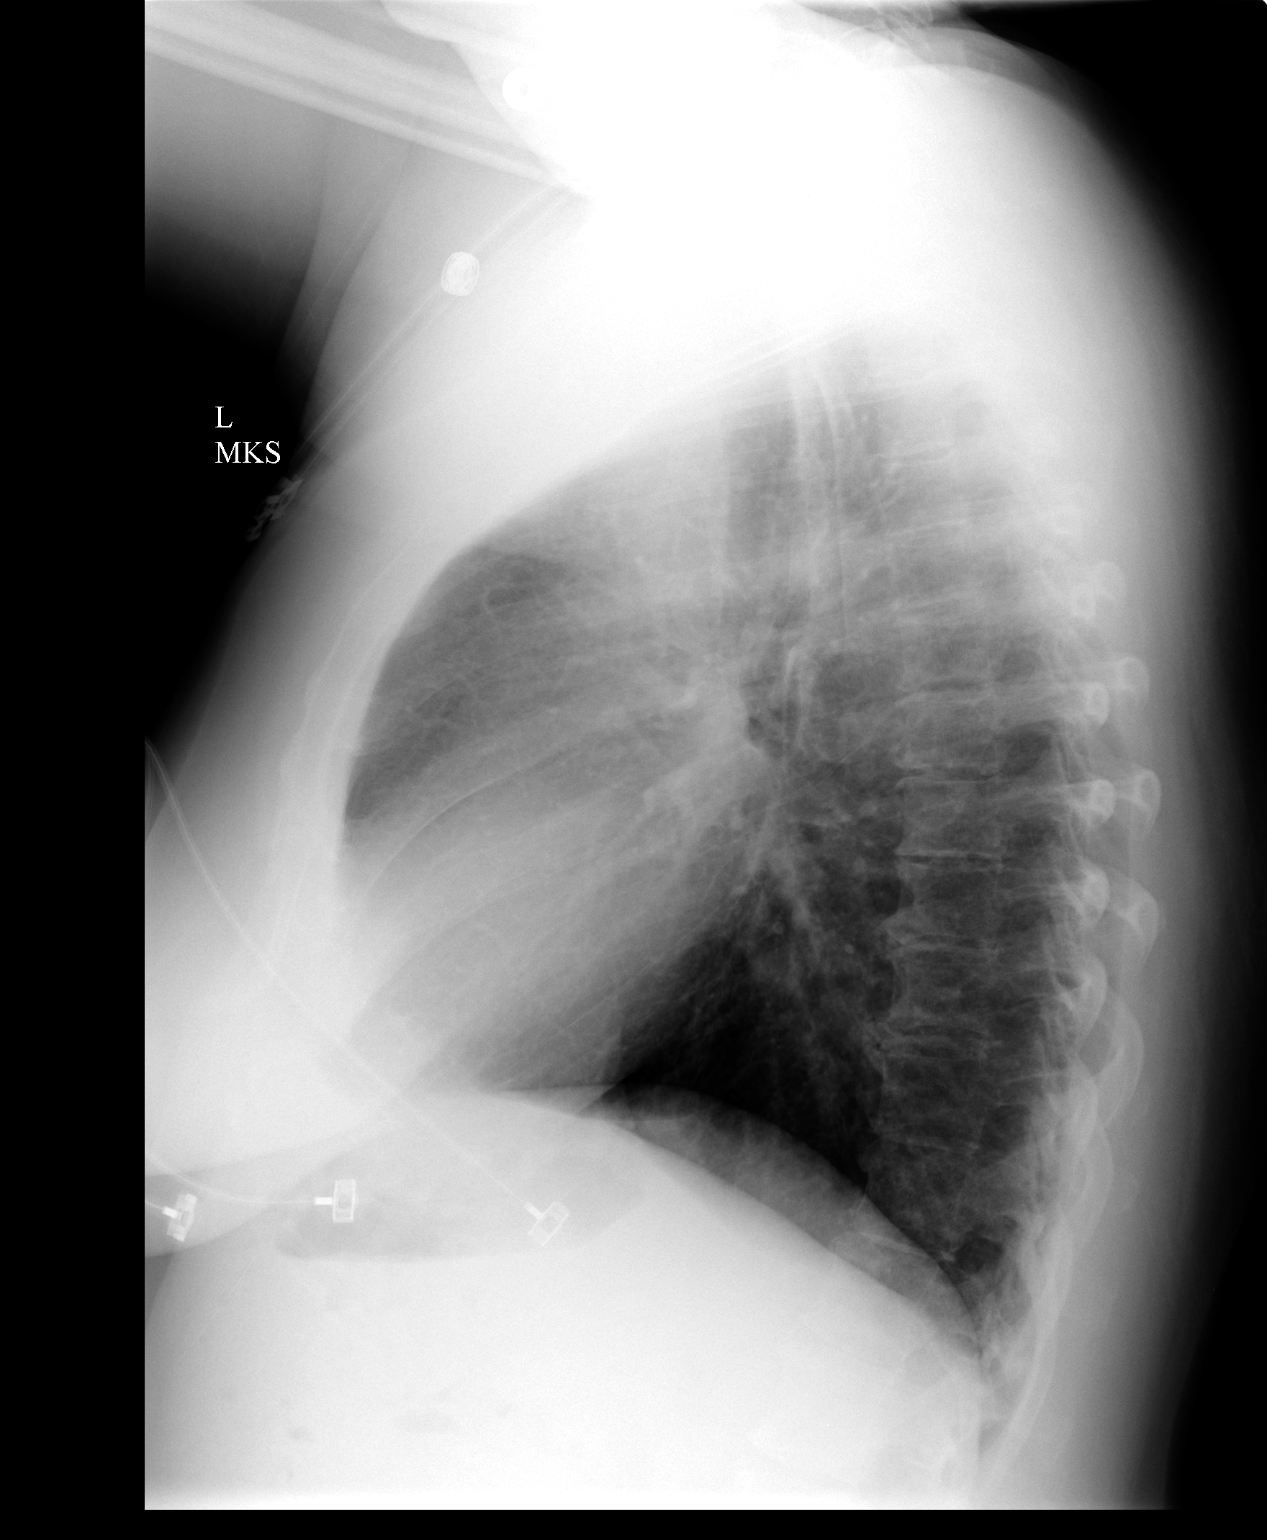

[2 of 2 positions shown; findings below may reference images not displayed]

FINDINGS: The lungs are mildly hyperinflated with hemidiaphragm flattening.
There is no focal infiltrate. The heart and pulmonary vascularity
are normal. There is no pleural effusion or pneumothorax. There is
curvature of the mid thoracic spine convex towards the right.
IMPRESSION: COPD.  There is no acute cardiopulmonary abnormality.

## 2016-02-09 NOTE — Progress Notes (Signed)
This encounter was created in error - please disregard.

## 2017-12-23 ENCOUNTER — Encounter: Payer: Self-pay | Admitting: Internal Medicine
